# Patient Record
Sex: Male | Born: 1950
Health system: Southern US, Community
[De-identification: ages and names within clinical notes are randomized; demographics above are authoritative.]

## PROBLEM LIST (undated history)

## (undated) DIAGNOSIS — D72820 Lymphocytosis (symptomatic): Secondary | ICD-10-CM

## (undated) DIAGNOSIS — E7800 Pure hypercholesterolemia, unspecified: Secondary | ICD-10-CM

## (undated) DIAGNOSIS — E119 Type 2 diabetes mellitus without complications: Secondary | ICD-10-CM

## (undated) DIAGNOSIS — I10 Essential (primary) hypertension: Secondary | ICD-10-CM

## (undated) DIAGNOSIS — D72829 Elevated white blood cell count, unspecified: Secondary | ICD-10-CM

## (undated) DIAGNOSIS — D473 Essential (hemorrhagic) thrombocythemia: Secondary | ICD-10-CM

## (undated) HISTORY — DX: Type 2 diabetes mellitus without complications: E11.9

## (undated) HISTORY — DX: Pure hypercholesterolemia, unspecified: E78.00

## (undated) HISTORY — PX: CHOLECYSTECTOMY: SHX55

## (undated) HISTORY — DX: Elevated white blood cell count, unspecified: D72.829

## (undated) HISTORY — DX: Essential (hemorrhagic) thrombocythemia: D47.3

## (undated) HISTORY — DX: Essential (primary) hypertension: I10

## (undated) HISTORY — DX: Lymphocytosis (symptomatic): D72.820

---

## 2005-01-02 ENCOUNTER — Ambulatory Visit: Payer: Self-pay | Admitting: Internal Medicine

## 2005-01-25 ENCOUNTER — Ambulatory Visit: Payer: Self-pay | Admitting: Internal Medicine

## 2007-03-12 ENCOUNTER — Emergency Department: Payer: Self-pay | Admitting: Emergency Medicine

## 2011-01-22 ENCOUNTER — Ambulatory Visit: Payer: Self-pay | Admitting: Cardiology

## 2015-10-01 ENCOUNTER — Encounter (HOSPITAL_COMMUNITY): Payer: Self-pay | Admitting: Oncology

## 2015-10-01 ENCOUNTER — Encounter (HOSPITAL_COMMUNITY): Payer: Medicare Other

## 2015-10-01 ENCOUNTER — Encounter (HOSPITAL_COMMUNITY): Payer: Medicare Other | Attending: Oncology | Admitting: Oncology

## 2015-10-01 VITALS — BP 158/75 | HR 70 | Temp 97.8°F | Resp 20 | Ht 70.0 in | Wt 214.0 lb

## 2015-10-01 DIAGNOSIS — Z794 Long term (current) use of insulin: Secondary | ICD-10-CM | POA: Diagnosis not present

## 2015-10-01 DIAGNOSIS — Z720 Tobacco use: Secondary | ICD-10-CM

## 2015-10-01 DIAGNOSIS — D72829 Elevated white blood cell count, unspecified: Secondary | ICD-10-CM

## 2015-10-01 DIAGNOSIS — Z7982 Long term (current) use of aspirin: Secondary | ICD-10-CM | POA: Insufficient documentation

## 2015-10-01 DIAGNOSIS — D72820 Lymphocytosis (symptomatic): Secondary | ICD-10-CM | POA: Diagnosis not present

## 2015-10-01 DIAGNOSIS — Z801 Family history of malignant neoplasm of trachea, bronchus and lung: Secondary | ICD-10-CM

## 2015-10-01 DIAGNOSIS — I10 Essential (primary) hypertension: Secondary | ICD-10-CM | POA: Diagnosis not present

## 2015-10-01 DIAGNOSIS — E7800 Pure hypercholesterolemia, unspecified: Secondary | ICD-10-CM | POA: Diagnosis not present

## 2015-10-01 DIAGNOSIS — D72821 Monocytosis (symptomatic): Secondary | ICD-10-CM | POA: Insufficient documentation

## 2015-10-01 DIAGNOSIS — Z9049 Acquired absence of other specified parts of digestive tract: Secondary | ICD-10-CM | POA: Insufficient documentation

## 2015-10-01 DIAGNOSIS — Z7984 Long term (current) use of oral hypoglycemic drugs: Secondary | ICD-10-CM | POA: Insufficient documentation

## 2015-10-01 DIAGNOSIS — D473 Essential (hemorrhagic) thrombocythemia: Secondary | ICD-10-CM

## 2015-10-01 DIAGNOSIS — F17210 Nicotine dependence, cigarettes, uncomplicated: Secondary | ICD-10-CM | POA: Insufficient documentation

## 2015-10-01 DIAGNOSIS — Z823 Family history of stroke: Secondary | ICD-10-CM | POA: Insufficient documentation

## 2015-10-01 DIAGNOSIS — E119 Type 2 diabetes mellitus without complications: Secondary | ICD-10-CM | POA: Insufficient documentation

## 2015-10-01 DIAGNOSIS — Z79899 Other long term (current) drug therapy: Secondary | ICD-10-CM | POA: Diagnosis not present

## 2015-10-01 DIAGNOSIS — D75839 Thrombocytosis, unspecified: Secondary | ICD-10-CM

## 2015-10-01 HISTORY — DX: Lymphocytosis (symptomatic): D72.820

## 2015-10-01 HISTORY — DX: Thrombocytosis, unspecified: D75.839

## 2015-10-01 HISTORY — DX: Elevated white blood cell count, unspecified: D72.829

## 2015-10-01 LAB — COMPREHENSIVE METABOLIC PANEL
ALK PHOS: 67 U/L (ref 38–126)
ALT: 27 U/L (ref 17–63)
AST: 24 U/L (ref 15–41)
Albumin: 4.3 g/dL (ref 3.5–5.0)
Anion gap: 9 (ref 5–15)
BUN: 19 mg/dL (ref 6–20)
CALCIUM: 9.8 mg/dL (ref 8.9–10.3)
CHLORIDE: 102 mmol/L (ref 101–111)
CO2: 24 mmol/L (ref 22–32)
CREATININE: 0.91 mg/dL (ref 0.61–1.24)
GFR calc Af Amer: >60 mL/min (ref >60)
Glucose, Bld: 156 mg/dL — ABNORMAL HIGH (ref 65–99)
Potassium: 4.5 mmol/L (ref 3.5–5.1)
SODIUM: 135 mmol/L (ref 135–145)
Total Bilirubin: 0.6 mg/dL (ref 0.3–1.2)
Total Protein: 7.6 g/dL (ref 6.5–8.1)

## 2015-10-01 LAB — CBC WITH DIFFERENTIAL/PLATELET
Basophils Absolute: 0.1 10*3/uL (ref 0.0–0.1)
Basophils Relative: 1 %
EOS ABS: 0.7 10*3/uL (ref 0.0–0.7)
EOS PCT: 5 %
HCT: 46.8 % (ref 39.0–52.0)
Hemoglobin: 15.8 g/dL (ref 13.0–17.0)
LYMPHS ABS: 4.6 10*3/uL — AB (ref 0.7–4.0)
Lymphocytes Relative: 32 %
MCH: 30.9 pg (ref 26.0–34.0)
MCHC: 33.8 g/dL (ref 30.0–36.0)
MCV: 91.4 fL (ref 78.0–100.0)
MONOS PCT: 10 %
Monocytes Absolute: 1.5 10*3/uL — ABNORMAL HIGH (ref 0.1–1.0)
Neutro Abs: 7.7 10*3/uL (ref 1.7–7.7)
Neutrophils Relative %: 52 %
PLATELETS: 415 10*3/uL — AB (ref 150–400)
RBC: 5.12 MIL/uL (ref 4.22–5.81)
RDW: 13.7 % (ref 11.5–15.5)
WBC: 14.6 10*3/uL — ABNORMAL HIGH (ref 4.0–10.5)

## 2015-10-01 LAB — C-REACTIVE PROTEIN: CRP: 0.6 mg/dL (ref <1.0)

## 2015-10-01 LAB — SEDIMENTATION RATE: Sed Rate: 13 mm/hr (ref 0–16)

## 2015-10-01 NOTE — Assessment & Plan Note (Addendum)
Leukocytosis with lymphocytosis, monocytosis, and eosinophilia dating back to at least 03/28/2015 with a mild thrombocytosis and normal HGB/HCT/RBC.  I reviewed the potential causes of leukocytosis (specifically mild neutrophilia) including but not limited to: ?Any active inflammatory condition or infection ?Cigarette smoking, which may be the most common cause of mild neutrophilia ?Previously diagnosed hematologic disease (such as acute and chronic leukemias, chronic myeloproliferative or myelodysplastic disease) ?The presence of, and treatment for, a chronic anxiety state, panic disorder, rage, or emotional stress (eg, posttraumatic stress disorder, depression) ?Presence of non-hematologic diseases known to increase neutrophil counts (eg, eclampsia, thyroid storm, hypercortisolism). ?Prior splenectomy or known asplenia ?Positive family history of neutrophilia ?Recent vaccination  ?Medications - Various medications may cause neutrophilia. However,  such cases are rare and appear in the literature as isolated case reports.  Plan today is to proceed with a CBC with peripheral smear review, evaluation for MPD, BCR-ABL to r/o CML, FLOW cytometry, CMET, CRP and ESR to look for occult inflammatory disease  We will see him back once his lab results have come back in approximately two weeks. We will go over everything at that time.  Patient is planning a trip to [facility] with departure on 6/2.  If we are unable to see the patient prior to his trip, we will see him back after he returns from his trip.

## 2015-10-01 NOTE — Patient Instructions (Signed)
[place] at [facility] Discharge Instructions  RECOMMENDATIONS MADE BY THE CONSULTANT AND ANY TEST RESULTS WILL BE SENT TO YOUR REFERRING PHYSICIAN.  Exam and discussion today with Kirby Crigler, PA and Dr. Whitney Muse. Lab work today. Return as scheduled in 2-3 weeks for follow up visit. Call the clinic should you have any questions or concerns.   Thank you for choosing [place] at [facility] to provide your oncology and hematology care.  To afford each patient quality time with our provider, please arrive at least 15 minutes before your scheduled appointment time.   Beginning January 23rd 2017 lab work for the [facility] will be done in the  Main lab at [facility] on 1st floor. If you have a lab appointment with the [facility] please come in thru the  Main Entrance and check in at the main information desk  You need to re-schedule your appointment should you arrive 10 or more minutes late.  We strive to give you quality time with our providers, and arriving late affects you and other patients whose appointments are after yours.  Also, if you no show three or more times for appointments you may be dismissed from the clinic at the providers discretion.     Again, thank you for choosing [facility].  Our hope is that these requests will decrease the amount of time that you wait before being seen by our physicians.       _____________________________________________________________  Should you have questions after your visit to [facility], please contact our office at [phone] between the hours of 8:30 a.m. and 4:30 p.m.  Voicemails left after 4:30 p.m. will not be returned until the following business day.  For prescription refill requests, have your pharmacy contact our office.         Resources For Cancer Patients and their Caregivers ? American Cancer Society: Can assist with transportation, wigs,  general needs, runs Look Good Feel Better.        734-804-5595 ? Cancer Care: Provides financial assistance, online support groups, medication/co-pay assistance.  1-800-813-HOPE 725 728 9296) ? [place] Assists [place] cancer patients and their families through emotional , educational and financial support.  (709)662-0086 ? [place] DSS Where to apply for food stamps, Medicaid and utility assistance. (573) 508-0162 ? RCATS: Transportation to medical appointments. 907 721 4310 ? Social Security Administration: May apply for disability if have a Stage IV cancer. 228-382-2665 317-234-1571 ? [employer], Disability and Transit Services: Assists with nutrition, care and transit needs. [place] Support Programs: @10RELATIVEDAYS @ > Cancer Support Group  2nd Tuesday of the month 1pm-2pm, Journey Room  > Creative Journey  3rd Tuesday of the month 1130am-1pm, Journey Room  > Look Good Feel Better  1st Wednesday of the month 10am-12 noon, Journey Room (Call [place] to register [phone])

## 2015-10-01 NOTE — Assessment & Plan Note (Signed)
Minimal-mild thrombocytosis dating back to at least 2016 (April).  Highest documented platelet count is 464,000.

## 2015-10-01 NOTE — Progress Notes (Signed)
[facility] Hematology/Oncology Consultation   Name: Logan Carey      MRN: [id number]   Date: 10/01/2015 Time:2:31 PM   REFERRING PHYSICIAN:   Angelina Ok, FNP ([facility] [place], [facility])  REASON FOR CONSULT:  Persistent Leukocytosis   DIAGNOSIS:  Leukocytosis with lymphocytosis, monocytosis, and eosinophilia dating back to at least 03/28/2015 with a minimal- mild thrombocytosis and normal HGB/HCT/RBC  HISTORY OF PRESENT ILLNESS:   Logan Carey is a 65 y.o. 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 Merlene Laughter, depression, DM type 2, HTN, dyslipidemia, obesity, and headaches,  who is referred to the [facility] for persistent Leukocytosis with lymphocytosis, monocytosis, and eosinophilia dating back to at least 03/28/2015.  On chart review, the patient is noted to have a mild thrombocytosis and normal HGB/HCT/RBC.  I personally reviewed and went over laboratory results with the patient.  The results are noted within this dictation.  According to records provided by his primary care provider:  I personally reviewed and went over radiographic studies with the patient.  The results are noted within this dictation.  No imaging available related to current issue.  Chart reviewed. Medications reviewed.  The patient's wife dominates the 53 of conversation today. She reports that the patient is here at the patient's primary care provider's request but being made to show up today by his wife.  He does have chronic pain syndrome is being managed by Dr. Merlene Laughter. He notes that he walks 2-3 hours per day. He denies any B symptoms but does note a 50 pound weight loss over one year, intentionally. He does note a chronic headaches.   Review of Systems  Constitutional: Positive for weight loss (Intentional, 50 lbs x 12 months). Negative for fever, chills and  malaise/fatigue.  HENT: Negative for sore throat.   Eyes: Negative.  Negative for blurred vision and double vision.  Respiratory: Negative.  Negative for cough, hemoptysis and shortness of breath.   Cardiovascular: Negative.  Negative for chest pain.  Gastrointestinal: Negative.  Negative for nausea, vomiting, abdominal pain, diarrhea, constipation, blood in stool and melena.  Genitourinary: Negative.  Negative for dysuria, frequency and hematuria.  Musculoskeletal: Negative.  Negative for falls.  Skin: Negative for itching and rash.  Neurological: Positive for headaches (Chronic). Negative for dizziness, seizures, loss of consciousness and weakness.  Endo/Heme/Allergies: Negative.  Does not bruise/bleed easily.  Psychiatric/Behavioral: Negative.  Negative for depression and suicidal ideas. The patient does not have insomnia.      PAST MEDICAL HISTORY:   Past Medical History  Diagnosis Date  . Leukocytosis 10/01/2015  . Thrombocytosis ([place]) 10/01/2015  . High cholesterol   . Diabetes mellitus without complication ([place])   . Hypertension     ALLERGIES: Not on File    MEDICATIONS: I have reviewed the patient's current medications.    No current outpatient prescriptions on file prior to visit.   No current facility-administered medications on file prior to visit.     PAST SURGICAL HISTORY Past Surgical History  Procedure Laterality Date  . Cholecystectomy      FAMILY HISTORY: Family History  Problem Relation Age of Onset  . Stroke Father   . Cancer Father 63    Lung cancer-smoker  . Hypertension Sister   . Hypertension Brother   . Hyperlipidemia Son   Mother deceased at the age of 70 secondary to smoke inhalation  from house fire Father passed away at the age of 34 secondary to stroke and lung cancer (he was a smoker). He has one sister who 5 years old and healthy He has a brother who is 80 years old who is alive but the patient does not speak to him. Last spoken to him  at his father's funeral in the 25s. One daughter 33 years old. She is a Pharmacist, hospital in [place]. On son 34 years old with hypercholesterolemia, otherwise healthy.   SOCIAL HISTORY: Patient has a 1 pack per day smoking history 50 years. He now smokes a quarter of a pack per day. He denies any alcohol abuse now after quitting 40 years ago. He reports that he used to drink approximately half of a fifth of liquor (Jim Beam) per weekend. He denies any illicit drug abuse. 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 He has since been on disability since 2007. He notes that he is [place] and religion. He's been married for 30 years to his current wife, Logan Carey. He's previously divorced. His son is from his previous marriage. His daughter, 55 years old is from his current marriage.   Social History   Social History  . Marital Status: Married    Spouse Name: N/A  . Number of Children: N/A  . Years of Education: N/A   Social History Main Topics  . Smoking status: Current Every Day Smoker -- 0.25 packs/day for 50 years    Types: Cigarettes  . Smokeless tobacco: None  . Alcohol Use: No     Comment: Use to drink 1/2 of a 5th of Jim Beam every weekend quitting in ~ 1977  . Drug Use: No  . Sexual Activity: Not Asked   Other Topics Concern  . None   Social History Narrative  . None   Outpatient Encounter Prescriptions as of 10/01/2015  Medication Sig Note  . aspirin 81 MG tablet Take 81 mg by mouth daily.   . benazepril (LOTENSIN) 20 MG tablet Take 20 mg by mouth 2 (two) times daily. 10/01/2015: Received from: External Pharmacy Received Sig:   . buPROPion (WELLBUTRIN XL) 150 MG 24 hr tablet Take 300 mg by mouth daily. 10/01/2015: Received from: External Pharmacy Received Sig:   . diazepam (VALIUM) 5 MG tablet Take 5 mg by mouth 3 (three) times daily. 10/01/2015: Received from: External Pharmacy Received Sig:   . fenofibrate 160  MG tablet Take 160 mg by mouth daily. 10/01/2015: Received from: External Pharmacy Received Sig:   . gabapentin (NEURONTIN) 300 MG capsule 1 capsule at 0800; 1 capsule at 1400; 2 capsules at bedtime 10/01/2015: Received from: External Pharmacy Received Sig:   . HUMALOG KWIKPEN 100 UNIT/ML KiwkPen Inject 35 Units into the skin 3 (three) times daily. 10/01/2015: Received from: External Pharmacy Received Sig:   . insulin detemir (LEVEMIR) 100 UNIT/ML injection Inject 70 Units into the skin at bedtime.   . metFORMIN (GLUCOPHAGE-XR) 500 MG 24 hr tablet Take 500 mg by mouth. 4 tabs daily with supper 10/01/2015: Received from: External Pharmacy Received Sig:   . NOVOTWIST 32G X 5 MM MISC  10/01/2015: Received from: External Pharmacy  . ONE TOUCH ULTRA TEST test strip  10/01/2015: Received from: External Pharmacy  . OVER THE COUNTER MEDICATION    . OVER THE COUNTER MEDICATION    . oxyCODONE-acetaminophen (PERCOCET) 10-325 MG tablet Take 10-325 tablets by mouth every 8 (eight) hours as  needed. 10/01/2015: Received from: External Pharmacy Received Sig:   . pravastatin (PRAVACHOL) 20 MG tablet Take 20 mg by mouth at bedtime. 10/01/2015: Received from: External Pharmacy Received Sig:   . topiramate (TOPAMAX) 25 MG tablet Take 3 tablets by mouth at bedtime. 10/01/2015: Received from: External Pharmacy Received Sig:    No facility-administered encounter medications on file as of 10/01/2015.    PERFORMANCE STATUS: The patient's performance status is 0 - Asymptomatic  PHYSICAL EXAM: Most Recent Vital Signs: Blood pressure 158/75, pulse 70, temperature 97.8 F (36.6 C), temperature source Oral, resp. rate 20, height 5' 10"  (1.778 m), weight 214 lb (97.07 kg), SpO2 97 %. General appearance: alert, cooperative, appears stated age, no distress, mildly obese and accompanied by his wife, Logan Carey Head: Normocephalic, without obvious abnormality, atraumatic Ears: normal TM's and external ear canals both ears Throat: normal  findings: lips normal without lesions, buccal mucosa normal, tongue midline and normal, soft palate, uvula, and tonsils normal and oropharynx pink & moist without lesions or evidence of thrush and abnormal findings: dentition: upper and lower dentures Neck: no adenopathy, supple, symmetrical, trachea midline and thyroid not enlarged, symmetric, no tenderness/mass/nodules Lungs: clear to auscultation bilaterally and normal percussion bilaterally Heart: regular rate and rhythm, S1, S2 normal, no murmur, click, rub or gallop Abdomen: soft, non-tender; bowel sounds normal; no masses,  no organomegaly Extremities: extremities normal, atraumatic, no cyanosis or edema, no edema, redness or tenderness in the calves or thighs and Left leg in a stability brace Skin: Skin color, texture, turgor normal. No rashes or lesions Lymph nodes: Cervical, supraclavicular, and axillary nodes normal. Neurologic: Alert and oriented X 3, normal strength and tone. Normal symmetric reflexes. Normal coordination and gait  LABORATORY DATA:  No labs in [facility] link.  07/05/2015: WBC 11.7 RBC 5.05 Hemoglobin 14.7 Hematocrit 45.1% MCV 89 MCHC 32.6 Platelets: 446 Neutrophils 5.8 Lymphocytes: 3.9 Monocytes: 1.2 Eosinophils: 0.9 Basophils: 0.1 Hemoglobin A1c: 7.2  05/04/2015: WBC 12.7 RBC 5.05 Hemoglobin 15.1 Hematocrit 44.8% MCV 89 MCHC 33.7  Platelets: 420 Neutrophils: 6.0 Lymphocytes: 4.2 Monocytes: 1.5 Eosinophils: 0.8 Basophils: 0.1  03/28/2015 WBC 12.0 RBC 5.06 Hemoglobin 15.4 Hematocrit 45.2% MCV 89 MCHC 34.1  Platelets: 464 Neutrophils: 6.0 Lymphocytes: 3.4 Monocytes: 1.6 Eosinophils: 0.8 Basophils: 0.1 Hemoglobin A1c: 8.8 PSA: 3.3  08/24/2014: WBC 13.4 RBC: 5.01 Hemoglobin: 15.2 Hematocrit: 46.8% MCV: 93.4 MCHC: 32.5 Platelets: 413 Neutrophil: 5.85 Lymphocytes: 5.18 Monocyte: 1.46 Eosinophils: 0.65 Basophil: 0.12 Hemoglobin A1c: 8.5  RADIOGRAPHY: No results found.  N/A   PATHOLOGY:  N/A  ASSESSMENT/PLAN:   Leukocytosis Leukocytosis with lymphocytosis, monocytosis, and eosinophilia dating back to at least 03/28/2015 with a mild thrombocytosis and normal HGB/HCT/RBC.  I reviewed the potential causes of leukocytosis (specifically mild neutrophilia) including but not limited to: ?Any active inflammatory condition or infection ?Cigarette smoking, which may be the most common cause of mild neutrophilia ?Previously diagnosed hematologic disease (such as acute and chronic leukemias, chronic myeloproliferative or myelodysplastic disease) ?The presence of, and treatment for, a chronic anxiety state, panic disorder, rage, or emotional stress (eg, posttraumatic stress disorder, depression) ?Presence of non-hematologic diseases known to increase neutrophil counts (eg, eclampsia, thyroid storm, hypercortisolism). ?Prior splenectomy or known asplenia ?Positive family history of neutrophilia ?Recent vaccination  ?Medications - Various medications may cause neutrophilia. However,  such cases are rare and appear in the literature as isolated case reports.  Plan today is to proceed with a CBC with peripheral smear review, evaluation for MPD, BCR-ABL to r/o CML, FLOW cytometry, CMET, CRP and  ESR to look for occult inflammatory disease  We will see him back once his lab results have come back in approximately two weeks. We will go over everything at that time.  Patient is planning a trip to [facility] with departure on 6/2.  If we are unable to see the patient prior to his trip, we will see him back after he returns from his trip.    Thrombocytosis ([place]) Minimal-mild thrombocytosis dating back to at least 2016 (April).  Highest documented platelet count is 464,000.  ORDERS PLACED FOR THIS ENCOUNTER: Orders Placed This Encounter  Procedures  . CBC with Differential  . Comprehensive metabolic panel  . Sedimentation rate  . Pathologist smear review  . C-reactive protein  .  BCR-ABL1, CML/ALL, PCR, QUANT  . JAK2 V617F, Rfx CALR/E12/MPL   FLOW CYTOMETRY  All questions were answered. The patient knows to call the clinic with any problems, questions or concerns. We can certainly see the patient much sooner if necessary.   This note is electronically signed by:  Molli Hazard, MD  10/01/2015 2:31 PM

## 2015-10-02 LAB — PATHOLOGIST SMEAR REVIEW

## 2015-10-05 LAB — BCR-ABL1, CML/ALL, PCR, QUANT

## 2015-10-08 ENCOUNTER — Encounter (HOSPITAL_COMMUNITY): Payer: Self-pay | Admitting: Oncology

## 2015-10-15 LAB — CALR + JAK2 E12-15 + MPL (REFLEXED)

## 2015-10-15 LAB — JAK2 V617F, W REFLEX TO CALR/E12/MPL

## 2015-10-18 ENCOUNTER — Encounter (HOSPITAL_COMMUNITY): Payer: Self-pay | Admitting: Hematology & Oncology

## 2015-10-18 ENCOUNTER — Encounter (HOSPITAL_COMMUNITY): Payer: Medicare Other | Attending: Hematology & Oncology | Admitting: Hematology & Oncology

## 2015-10-18 VITALS — BP 166/86 | HR 72 | Temp 98.0°F | Resp 18 | Wt 213.5 lb

## 2015-10-18 DIAGNOSIS — Z794 Long term (current) use of insulin: Secondary | ICD-10-CM | POA: Insufficient documentation

## 2015-10-18 DIAGNOSIS — D72829 Elevated white blood cell count, unspecified: Secondary | ICD-10-CM | POA: Insufficient documentation

## 2015-10-18 DIAGNOSIS — D473 Essential (hemorrhagic) thrombocythemia: Secondary | ICD-10-CM | POA: Diagnosis not present

## 2015-10-18 DIAGNOSIS — Z7984 Long term (current) use of oral hypoglycemic drugs: Secondary | ICD-10-CM | POA: Insufficient documentation

## 2015-10-18 DIAGNOSIS — F17210 Nicotine dependence, cigarettes, uncomplicated: Secondary | ICD-10-CM | POA: Insufficient documentation

## 2015-10-18 DIAGNOSIS — E7800 Pure hypercholesterolemia, unspecified: Secondary | ICD-10-CM | POA: Insufficient documentation

## 2015-10-18 DIAGNOSIS — Z7982 Long term (current) use of aspirin: Secondary | ICD-10-CM | POA: Insufficient documentation

## 2015-10-18 DIAGNOSIS — Z720 Tobacco use: Secondary | ICD-10-CM | POA: Diagnosis not present

## 2015-10-18 DIAGNOSIS — I10 Essential (primary) hypertension: Secondary | ICD-10-CM | POA: Insufficient documentation

## 2015-10-18 DIAGNOSIS — D72820 Lymphocytosis (symptomatic): Secondary | ICD-10-CM | POA: Insufficient documentation

## 2015-10-18 DIAGNOSIS — D72821 Monocytosis (symptomatic): Secondary | ICD-10-CM | POA: Insufficient documentation

## 2015-10-18 DIAGNOSIS — D75839 Thrombocytosis, unspecified: Secondary | ICD-10-CM

## 2015-10-18 DIAGNOSIS — Z79899 Other long term (current) drug therapy: Secondary | ICD-10-CM | POA: Insufficient documentation

## 2015-10-18 DIAGNOSIS — Z801 Family history of malignant neoplasm of trachea, bronchus and lung: Secondary | ICD-10-CM | POA: Insufficient documentation

## 2015-10-18 DIAGNOSIS — E119 Type 2 diabetes mellitus without complications: Secondary | ICD-10-CM | POA: Insufficient documentation

## 2015-10-18 DIAGNOSIS — Z823 Family history of stroke: Secondary | ICD-10-CM | POA: Insufficient documentation

## 2015-10-18 DIAGNOSIS — Z9049 Acquired absence of other specified parts of digestive tract: Secondary | ICD-10-CM | POA: Insufficient documentation

## 2015-10-18 NOTE — Patient Instructions (Signed)
[place] at [facility] Discharge Instructions  RECOMMENDATIONS MADE BY THE CONSULTANT AND ANY TEST RESULTS WILL BE SENT TO YOUR REFERRING PHYSICIAN.  Bone marrow biopsy 11/06/15  Return to clinic 2 weeks after biopsy   Thank you for choosing [place] at [facility] to provide your oncology and hematology care.  To afford each patient quality time with our provider, please arrive at least 15 minutes before your scheduled appointment time.   Beginning January 23rd 2017 lab work for the [facility] will be done in the  Main lab at [facility] on 1st floor. If you have a lab appointment with the [facility] please come in thru the  Main Entrance and check in at the main information desk  You need to re-schedule your appointment should you arrive 10 or more minutes late.  We strive to give you quality time with our providers, and arriving late affects you and other patients whose appointments are after yours.  Also, if you no show three or more times for appointments you may be dismissed from the clinic at the providers discretion.     Again, thank you for choosing [facility].  Our hope is that these requests will decrease the amount of time that you wait before being seen by our physicians.       _____________________________________________________________  Should you have questions after your visit to [facility], please contact our office at [phone] between the hours of 8:30 a.m. and 4:30 p.m.  Voicemails left after 4:30 p.m. will not be returned until the following business day.  For prescription refill requests, have your pharmacy contact our office.         Resources For Cancer Patients and their Caregivers ? American Cancer Society: Can assist with transportation, wigs, general needs, runs Look Good Feel Better.        516-086-0649 ? Cancer Care: Provides financial assistance, online support  groups, medication/co-pay assistance.  1-800-813-HOPE (254)710-5684) ? [place] Assists [place] cancer patients and their families through emotional , educational and financial support.  475-624-3806 ? [place] DSS Where to apply for food stamps, Medicaid and utility assistance. (351)168-3487 ? RCATS: Transportation to medical appointments. 262-418-4575 ? Social Security Administration: May apply for disability if have a Stage IV cancer. ([phone] 769-2473 734-356-3651 ? [employer], Disability and Transit Services: Assists with nutrition, care and transit needs. [place] Support Programs: _0 @ > Cancer Support Group  2nd Tuesday of the month 1pm-2pm, Journey Room  > Creative Journey  3rd Tuesday of the month 1130am-1pm, Journey Room  > Look Good Feel Better  1st Wednesday of the month 10am-12 noon, Journey Room (Call [place] to register [phone])

## 2015-10-18 NOTE — Progress Notes (Signed)
[facility] Hematology/Oncology Consultation   Name: Logan Carey      MRN: [id number]   Date: 10/18/2015 Time:11:05 AM   REFERRING PHYSICIAN:   Angelina Ok, FNP ([facility] [place], [facility])  REASON FOR CONSULT:  Persistent Leukocytosis   DIAGNOSIS:  Leukocytosis with lymphocytosis, monocytosis, and eosinophilia dating back to at least 03/28/2015 with a minimal- mild thrombocytosis and normal HGB/HCT/RBC  HISTORY OF PRESENT ILLNESS:   Logan Carey is a 65 y.o. 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 Merlene Laughter, depression, DM type 2, HTN, dyslipidemia, obesity, and headaches,  who is referred to the [facility] for persistent Leukocytosis with lymphocytosis, monocytosis, and eosinophilia dating back to at least 03/28/2015.  On chart review, the patient is noted to have a mild thrombocytosis and normal HGB/HCT/RBC.  I personally reviewed and went over laboratory results with the patient.  The results are noted within this dictation.  Logan Carey returns to the Cancer center today accompanied by his wife.  He says that he is going to be visiting [place] soon, but that their son-in-law has an "interesting approach to their upcoming visit; that it's "his" house, not "his and their daughter's house." He is going to [place] tomorrow and will be gone through next Sunday.  He notes no changes since his last visit. Overall feels well. Appetite and energy are unchanged.     Review of Systems  Constitutional: Positive for weight loss (Intentional, 50 lbs x 12 months). Negative for fever, chills and malaise/fatigue.  HENT: Negative for sore throat.   Eyes: Negative.  Negative for blurred vision and double vision.  Respiratory: Negative.  Negative for cough, hemoptysis and shortness of breath.   Cardiovascular: Negative.  Negative for chest pain.    Gastrointestinal: Negative.  Negative for nausea, vomiting, abdominal pain, diarrhea, constipation, blood in stool and melena.  Genitourinary: Negative.  Negative for dysuria, frequency and hematuria.  Musculoskeletal: Negative.  Negative for falls.  Skin: Negative for itching and rash.  Neurological: Positive for headaches (Chronic). Negative for dizziness, seizures, loss of consciousness and weakness.  Endo/Heme/Allergies: Negative.  Does not bruise/bleed easily.  Psychiatric/Behavioral: Negative.  Negative for depression and suicidal ideas. The patient does not have insomnia.   14 point review of systems was performed and is negative except as detailed under history of present illness and above  PAST MEDICAL HISTORY:   Past Medical History  Diagnosis Date  . Leukocytosis 10/01/2015  . Thrombocytosis ([place]) 10/01/2015  . High cholesterol   . Diabetes mellitus without complication ([place])   . Hypertension     ALLERGIES: Not on File    MEDICATIONS: I have reviewed the patient's current medications.    Current Outpatient Prescriptions on File Prior to Visit  Medication Sig Dispense Refill  . aspirin 81 MG tablet Take 81 mg by mouth daily.    . benazepril (LOTENSIN) 20 MG tablet Take 20 mg by mouth 2 (two) times daily.    Marland Kitchen buPROPion (WELLBUTRIN XL) 150 MG 24 hr tablet Take 300 mg by mouth daily.    . diazepam (VALIUM) 5 MG tablet Take 5 mg by mouth 3 (three) times daily.    . fenofibrate 160 MG tablet Take 160 mg by mouth daily.    Marland Kitchen gabapentin (NEURONTIN) 300 MG capsule 1 capsule at 0800; 1 capsule at 1400; 2 capsules at bedtime    .  HUMALOG KWIKPEN 100 UNIT/ML KiwkPen Inject 35 Units into the skin 3 (three) times daily.    . insulin detemir (LEVEMIR) 100 UNIT/ML injection Inject 70 Units into the skin at bedtime.    . metFORMIN (GLUCOPHAGE-XR) 500 MG 24 hr tablet Take 500 mg by mouth. 4 tabs daily with supper    . NOVOTWIST 32G X 5 MM MISC     . ONE TOUCH ULTRA TEST test strip      . oxyCODONE-acetaminophen (PERCOCET) 10-325 MG tablet Take 10-325 tablets by mouth every 8 (eight) hours as needed.    . pravastatin (PRAVACHOL) 20 MG tablet Take 20 mg by mouth at bedtime.    . topiramate (TOPAMAX) 25 MG tablet Take 3 tablets by mouth at bedtime.    Marland Kitchen OVER THE COUNTER MEDICATION     . OVER THE COUNTER MEDICATION      No current facility-administered medications on file prior to visit.     PAST SURGICAL HISTORY Past Surgical History  Procedure Laterality Date  . Cholecystectomy      FAMILY HISTORY: Family History  Problem Relation Age of Onset  . Stroke Father   . Cancer Father 67    Lung cancer-smoker  . Hypertension Sister   . Hypertension Brother   . Hyperlipidemia Son   Mother deceased at the age of 89 secondary to smoke inhalation from house fire Father passed away at the age of 51 secondary to stroke and lung cancer (he was a smoker). He has one sister who 47 years old and healthy He has a brother who is 61 years old who is alive but the patient does not speak to him. Last spoken to him at his father's funeral in the 58s. One daughter 52 years old. She is a Pharmacist, hospital in [place]. On son 45 years old with hypercholesterolemia, otherwise healthy.   SOCIAL HISTORY: Patient has a 1 pack per day smoking history 50 years. He now smokes a quarter of a pack per day. He denies any alcohol abuse now after quitting 40 years ago. He reports that he used to drink approximately half of a fifth of liquor (Jim Beam) per weekend. He denies any illicit drug abuse. 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 He has since been on disability since 2007. He notes that he is [place] and religion. He's been married for 30 years to his current wife, Logan Carey. He's previously divorced. His son is from his previous marriage. His daughter, 64 years old is from his current marriage.   Social History   Social  History  . Marital Status: Married    Spouse Name: N/A  . Number of Children: N/A  . Years of Education: N/A   Social History Main Topics  . Smoking status: Current Every Day Smoker -- 50 years    Types: Cigarettes  . Smokeless tobacco: None  . Alcohol Use: No     Comment: Use to drink 1/2 of a 5th of Jim Beam every weekend quitting in ~ 1977  . Drug Use: No  . Sexual Activity: Not Asked   Other Topics Concern  . None   Social History Narrative   Outpatient Encounter Prescriptions as of 10/01/2015  Medication Sig Note  . aspirin 81 MG tablet Take 81 mg by mouth daily.   . benazepril (LOTENSIN) 20 MG tablet Take 20 mg by mouth 2 (two) times daily. 10/01/2015: Received from: External Pharmacy Received  Sig:   . buPROPion (WELLBUTRIN XL) 150 MG 24 hr tablet Take 300 mg by mouth daily. 10/01/2015: Received from: External Pharmacy Received Sig:   . diazepam (VALIUM) 5 MG tablet Take 5 mg by mouth 3 (three) times daily. 10/01/2015: Received from: External Pharmacy Received Sig:   . fenofibrate 160 MG tablet Take 160 mg by mouth daily. 10/01/2015: Received from: External Pharmacy Received Sig:   . gabapentin (NEURONTIN) 300 MG capsule 1 capsule at 0800; 1 capsule at 1400; 2 capsules at bedtime 10/01/2015: Received from: External Pharmacy Received Sig:   . HUMALOG KWIKPEN 100 UNIT/ML KiwkPen Inject 35 Units into the skin 3 (three) times daily. 10/01/2015: Received from: External Pharmacy Received Sig:   . insulin detemir (LEVEMIR) 100 UNIT/ML injection Inject 70 Units into the skin at bedtime.   . metFORMIN (GLUCOPHAGE-XR) 500 MG 24 hr tablet Take 500 mg by mouth. 4 tabs daily with supper 10/01/2015: Received from: External Pharmacy Received Sig:   . NOVOTWIST 32G X 5 MM MISC  10/01/2015: Received from: External Pharmacy  . ONE TOUCH ULTRA TEST test strip  10/01/2015: Received from: External Pharmacy  . OVER THE COUNTER MEDICATION    . OVER THE COUNTER MEDICATION    . oxyCODONE-acetaminophen  (PERCOCET) 10-325 MG tablet Take 10-325 tablets by mouth every 8 (eight) hours as needed. 10/01/2015: Received from: External Pharmacy Received Sig:   . pravastatin (PRAVACHOL) 20 MG tablet Take 20 mg by mouth at bedtime. 10/01/2015: Received from: External Pharmacy Received Sig:   . topiramate (TOPAMAX) 25 MG tablet Take 3 tablets by mouth at bedtime. 10/01/2015: Received from: External Pharmacy Received Sig:    No facility-administered encounter medications on file as of 10/01/2015.   14 point review of systems was performed and is negative except as detailed under history of present illness and above    PERFORMANCE STATUS: The patient's performance status is 0 - Asymptomatic  PHYSICAL EXAM: Most Recent Vital Signs: Blood pressure 166/86, pulse 72, temperature 98 F (36.7 C), temperature source Oral, resp. rate 18, weight 213 lb 8 oz (96.843 kg), SpO2 97 %. General appearance: alert, cooperative, appears stated age, no distress, mildly obese and accompanied by his wife, Logan Carey Head: Normocephalic, without obvious abnormality, atraumatic Ears: normal TM's and external ear canals both ears Throat: normal findings: lips normal without lesions, buccal mucosa normal, tongue midline and normal, soft palate, uvula, and tonsils normal and oropharynx pink & moist without lesions or evidence of thrush and abnormal findings: dentition: upper and lower dentures Neck: no adenopathy, supple, symmetrical, trachea midline and thyroid not enlarged, symmetric, no tenderness/mass/nodules Lungs: clear to auscultation bilaterally and normal percussion bilaterally Heart: regular rate and rhythm, S1, S2 normal, no murmur, click, rub or gallop Abdomen: soft, non-tender; bowel sounds normal; no masses,  no organomegaly Extremities: extremities normal, atraumatic, no cyanosis or edema, no edema, redness or tenderness in the calves or thighs and Left leg in a stability brace Skin: Skin color, texture, turgor normal. No  rashes or lesions Lymph nodes: Cervical, supraclavicular, and axillary nodes normal. Neurologic: Alert and oriented X 3, normal strength and tone. Normal symmetric reflexes. Normal coordination and gait  LABORATORY DATA:  No labs in [facility] link.  07/05/2015: WBC 11.7 RBC 5.05 Hemoglobin 14.7 Hematocrit 45.1% MCV 89 MCHC 32.6 Platelets: 446 Neutrophils 5.8 Lymphocytes: 3.9 Monocytes: 1.2 Eosinophils: 0.9 Basophils: 0.1 Hemoglobin A1c: 7.2  05/04/2015: WBC 12.7 RBC 5.05 Hemoglobin 15.1 Hematocrit 44.8% MCV 89 MCHC 33.7  Platelets: 420 Neutrophils: 6.0 Lymphocytes: 4.2  Monocytes: 1.5 Eosinophils: 0.8 Basophils: 0.1  03/28/2015 WBC 12.0 RBC 5.06 Hemoglobin 15.4 Hematocrit 45.2% MCV 89 MCHC 34.1  Platelets: 464 Neutrophils: 6.0 Lymphocytes: 3.4 Monocytes: 1.6 Eosinophils: 0.8 Basophils: 0.1 Hemoglobin A1c: 8.8 PSA: 3.3  08/24/2014: WBC 13.4 RBC: 5.01 Hemoglobin: 15.2 Hematocrit: 46.8% MCV: 93.4 MCHC: 32.5 Platelets: 413 Neutrophil: 5.85 Lymphocytes: 5.18 Monocyte: 1.46 Eosinophils: 0.65 Basophil: 0.12 Hemoglobin A1c: 8.5  Results for Logan Carey, Logan Carey (MRN [id number]) as of 10/21/2015 15:38  Ref. Range 10/01/2015 12:29  CRP Latest Ref Range: <1.0 mg/dL 0.6      Results for Logan Carey, Logan Carey (MRN [id number]) as of 10/21/2015 15:38  Ref. Range 10/01/2015 12:29  Sed Rate Latest Ref Range: 0-16 mm/hr 13     JAK 2 EXON 12 mutation negative JAK 2 V617 F mutation negative CALR mutation negative MPL mutation negative BCR ABL negative   RADIOGRAPHY: No results found. N/A   PATHOLOGY:  N/A  ASSESSMENT/PLAN:  Leukocytosis Thrombocytosis  We reviewed all labs obtained at his last visit. Inflammatory markers are WNL. Genetic studies are unremarkable.  We discussed observation vs. Proceeding with BMBX.  He is interested in proceeding with a BMBX.  We discussed how a BMBX is performed and risks and benefits including bleding and infection. Benefits are  to rule out a primary bone marrow "disorder" not detected by our peripheral studies.  We will order his BMBX and see him back 2 weeks post to review.  Orders Placed This Encounter  Procedures  . CT Biopsy    Standing Status: Future     Number of Occurrences:      Standing Expiration Date: 10/17/2016    Order Specific Question:  Lab orders requested (DO NOT place separate lab orders, these will be automatically ordered during procedure specimen collection):    Answer:  Surgical Pathology    Order Specific Question:  Reason for Exam (SYMPTOM  OR DIAGNOSIS REQUIRED)    Answer:  BMBX and aspirate, leukocytosis and thrombocytosis, flow cytometry and cytogenetics    Order Specific Question:  Preferred imaging location?    Answer:  [facility]    All questions were answered. The patient knows to call the clinic with any problems, questions or concerns. We can certainly see the patient much sooner if necessary.  This document serves as a record of services personally performed by Ancil Linsey, MD. It was created on her behalf by Toni Amend, a trained medical scribe. The creation of this record is based on the scribe's personal observations and the provider's statements to them. This document has been checked and approved by the attending provider.  I have reviewed the above documentation for accuracy and completeness and I agree with the above.  This note is electronically signed by:  Molli Hazard, MD  10/18/2015 11:05 AM

## 2015-11-05 ENCOUNTER — Other Ambulatory Visit: Payer: Self-pay | Admitting: Radiology

## 2015-11-06 ENCOUNTER — Ambulatory Visit (HOSPITAL_COMMUNITY)
Admission: RE | Admit: 2015-11-06 | Discharge: 2015-11-06 | Disposition: A | Discharge status: Home / Self Care | Payer: Medicare Other | Source: Ambulatory Visit | Attending: Hematology & Oncology | Admitting: Hematology & Oncology

## 2015-11-06 ENCOUNTER — Encounter (HOSPITAL_COMMUNITY): Payer: Self-pay

## 2015-11-06 DIAGNOSIS — D72829 Elevated white blood cell count, unspecified: Secondary | ICD-10-CM | POA: Diagnosis present

## 2015-11-06 DIAGNOSIS — E7800 Pure hypercholesterolemia, unspecified: Secondary | ICD-10-CM | POA: Insufficient documentation

## 2015-11-06 DIAGNOSIS — D75839 Thrombocytosis, unspecified: Secondary | ICD-10-CM

## 2015-11-06 DIAGNOSIS — D473 Essential (hemorrhagic) thrombocythemia: Secondary | ICD-10-CM | POA: Diagnosis present

## 2015-11-06 DIAGNOSIS — Z7984 Long term (current) use of oral hypoglycemic drugs: Secondary | ICD-10-CM | POA: Diagnosis not present

## 2015-11-06 DIAGNOSIS — I10 Essential (primary) hypertension: Secondary | ICD-10-CM | POA: Diagnosis not present

## 2015-11-06 DIAGNOSIS — E119 Type 2 diabetes mellitus without complications: Secondary | ICD-10-CM | POA: Diagnosis not present

## 2015-11-06 DIAGNOSIS — Z7982 Long term (current) use of aspirin: Secondary | ICD-10-CM | POA: Insufficient documentation

## 2015-11-06 DIAGNOSIS — D72822 Plasmacytosis: Secondary | ICD-10-CM | POA: Insufficient documentation

## 2015-11-06 LAB — CBC
HCT: 45.6 % (ref 39.0–52.0)
Hemoglobin: 16 g/dL (ref 13.0–17.0)
MCH: 31.1 pg (ref 26.0–34.0)
MCHC: 35.1 g/dL (ref 30.0–36.0)
MCV: 88.7 fL (ref 78.0–100.0)
PLATELETS: 454 10*3/uL — AB (ref 150–400)
RBC: 5.14 MIL/uL (ref 4.22–5.81)
RDW: 13.9 % (ref 11.5–15.5)
WBC: 16.7 10*3/uL — AB (ref 4.0–10.5)

## 2015-11-06 LAB — GLUCOSE, CAPILLARY: GLUCOSE-CAPILLARY: 146 mg/dL — AB (ref 65–99)

## 2015-11-06 LAB — PROTIME-INR
INR: 1.03 (ref 0.00–1.49)
Prothrombin Time: 13.2 seconds (ref 11.6–15.2)

## 2015-11-06 LAB — APTT: APTT: 29 s (ref 24–37)

## 2015-11-06 LAB — BONE MARROW EXAM

## 2015-11-06 MED ORDER — MIDAZOLAM HCL 2 MG/2ML IJ SOLN
INTRAMUSCULAR | Status: AC | PRN
  Administered 2015-11-06 (×2): 1 mg via INTRAVENOUS

## 2015-11-06 MED ORDER — MIDAZOLAM HCL 2 MG/2ML IJ SOLN
INTRAMUSCULAR | Status: AC
  Filled 2015-11-06: qty 4

## 2015-11-06 MED ORDER — SODIUM CHLORIDE 0.9 % IV SOLN
Freq: Once | INTRAVENOUS | Status: AC
  Administered 2015-11-06: 08:00:00 via INTRAVENOUS

## 2015-11-06 MED ORDER — FENTANYL CITRATE (PF) 100 MCG/2ML IJ SOLN
INTRAMUSCULAR | Status: AC | PRN
  Administered 2015-11-06: 50 ug via INTRAVENOUS
  Administered 2015-11-06: 25 ug via INTRAVENOUS

## 2015-11-06 MED ORDER — FENTANYL CITRATE (PF) 100 MCG/2ML IJ SOLN
INTRAMUSCULAR | Status: AC
  Filled 2015-11-06: qty 4

## 2015-11-06 NOTE — Discharge Instructions (Signed)
Bone Marrow Aspiration and Bone Marrow Biopsy °Bone marrow aspiration and bone marrow biopsy are procedures that are done to diagnose blood disorders. You may also have one of these procedures to help diagnose infections or some types of cancer. °Bone marrow is the soft tissue that is inside your bones. Blood cells are produced in bone marrow. For bone marrow aspiration, a sample of tissue in liquid form is removed from inside your bone. For a bone marrow biopsy, a small core of bone marrow tissue is removed. Then these samples are examined under a microscope or tested in a lab. °You may need these procedures if you have an abnormal complete blood count (CBC). The aspiration or biopsy sample is usually taken from the top of your hip bone. Sometimes, an aspiration sample is taken from your chest bone (sternum). °LET YOUR HEALTH CARE PROVIDER KNOW ABOUT: °· Any allergies you have. °· All medicines you are taking, including vitamins, herbs, eye drops, creams, and over-the-counter medicines. °· Previous problems you or members of your family have had with the use of anesthetics. °· Any blood disorders you have. °· Previous surgeries you have had. °· Any medical conditions you may have. °· Whether you are pregnant or you think that you may be pregnant. °RISKS AND COMPLICATIONS °Generally, this is a safe procedure. However, problems may occur, including: °· Infection. °· Bleeding. °BEFORE THE PROCEDURE °· Ask your health care provider about: °¨ Changing or stopping your regular medicines. This is especially important if you are taking diabetes medicines or blood thinners. °¨ Taking medicines such as aspirin and ibuprofen. These medicines can thin your blood. Do not take these medicines before your procedure if your health care provider instructs you not to. °· Plan to have someone take you home after the procedure. °· If you go home right after the procedure, plan to have someone with you for 24 hours. °PROCEDURE  °· An  IV tube may be inserted into one of your veins. °· The injection site will be cleaned with a germ-killing solution (antiseptic). °· You will be given one or more of the following: °¨ A medicine that helps you relax (sedative). °¨ A medicine that numbs the area (local anesthetic). °· The bone marrow sample will be removed as follows: °¨ For an aspiration, a hollow needle will be inserted through your skin and into your bone. Bone marrow fluid will be drawn up into a syringe. °¨ For a biopsy, your health care provider will use a hollow needle to remove a core of tissue from your bone marrow. °· The needle will be removed. °· A bandage (dressing) will be placed over the insertion site and taped in place. °The procedure may vary among health care providers and hospitals. °AFTER THE PROCEDURE °· Your blood pressure, heart rate, breathing rate, and blood oxygen level will be monitored often until the medicines you were given have worn off. °· Return to your normal activities as directed by your health care provider. °  °This information is not intended to replace advice given to you by your health care provider. Make sure you discuss any questions you have with your health care provider. °  °Document Released: 05/08/2004 Document Revised: 09/19/2014 Document Reviewed: 04/26/2014 °Elsevier Interactive Patient Education ©2016 [employer] Inc[employer] ° °Bone Marrow Aspiration and Bone Marrow Biopsy, Care After °Refer to this sheet in the next few weeks. These instructions provide you with information about caring for yourself after your procedure. Your health care provider may also give   you more specific instructions. Your treatment has been planned according to current medical practices, but problems sometimes occur. Call your health care provider if you have any problems or questions after your procedure. °WHAT TO EXPECT AFTER THE PROCEDURE °After your procedure, it is common to have: °· Soreness or tenderness around the puncture  site. °· Bruising. °HOME CARE INSTRUCTIONS °· Take medicines only as directed by your health care provider. °· Follow your health care provider's instructions about: °¨ Puncture site care. °¨ Bandage (dressing) changes and removal. °· Bathe and shower as directed by your health care provider. °· Check your puncture site every day for signs of infection. Watch for: °¨ Redness, swelling, or pain. °¨ Fluid, blood, or pus. °· Return to your normal activities as directed by your health care provider. °· Keep all follow-up visits as directed by your health care provider. This is important. °SEEK MEDICAL CARE IF: °· You have a fever. °· You have uncontrollable bleeding. °· You have redness, swelling, or pain at the site of your puncture. °· You have fluid, blood, or pus coming from your puncture site. °  °This information is not intended to replace advice given to you by your health care provider. Make sure you discuss any questions you have with your health care provider. °  °Document Released: 11/22/2004 Document Revised: 09/19/2014 Document Reviewed: 04/26/2014 °Elsevier Interactive Patient Education ©2016 Elsevier Inc. ° °Moderate Conscious Sedation, Adult, Care After °Refer to this sheet in the next few weeks. These instructions provide you with information on caring for yourself after your procedure. Your health care provider may also give you more specific instructions. Your treatment has been planned according to current medical practices, but problems sometimes occur. Call your health care provider if you have any problems or questions after your procedure. °WHAT TO EXPECT AFTER THE PROCEDURE  °After your procedure: °· You may feel sleepy, clumsy, and have poor balance for several hours. °· Vomiting may occur if you eat too soon after the procedure. °HOME CARE INSTRUCTIONS °· Do not participate in any activities where you could become injured for at least 24 hours. Do not: °¨ Drive. °¨ Swim. °¨ Ride a  bicycle. °¨ Operate heavy machinery. °¨ Cook. °¨ Use power tools. °¨ Climb ladders. °¨ Work from a high place. °· Do not make important decisions or sign legal documents until you are improved. °· If you vomit, drink water, juice, or soup when you can drink without vomiting. Make sure you have little or no nausea before eating solid foods. °· Only take over-the-counter or prescription medicines for pain, discomfort, or fever as directed by your health care provider. °· Make sure you and your family fully understand everything about the medicines given to you, including what side effects may occur. °· You should not drink alcohol, take sleeping pills, or take medicines that cause drowsiness for at least 24 hours. °· If you smoke, do not smoke without supervision. °· If you are feeling better, you may resume normal activities 24 hours after you were sedated. °· Keep all appointments with your health care provider. °SEEK MEDICAL CARE IF: °· Your skin is pale or bluish in color. °· You continue to feel nauseous or vomit. °· Your pain is getting worse and is not helped by medicine. °· You have bleeding or swelling. °· You are still sleepy or feeling clumsy after 24 hours. °SEEK IMMEDIATE MEDICAL CARE IF: °· You develop a rash. °· You have difficulty breathing. °· You develop any   of allergic problem.  You have a fever. MAKE SURE YOU:  Understand these instructions.  Will watch your condition.  Will get help right away if you are not doing well or get worse.   This information is not intended to replace advice given to you by your health care provider. Make sure you discuss any questions you have with your health care provider.   Document Released: 02/23/2013 Document Revised: 05/26/2014 Document Reviewed: 02/23/2013 Elsevier Interactive Patient Education [employer].

## 2015-11-06 NOTE — Procedures (Signed)
Interventional Radiology Procedure Note  Procedure: CT guided aspirate and core biopsy of right posterior iliac bone Complications: None Recommendations: - Bedrest supine x 1 hrs - OTC's PRN  Pain - Follow biopsy results  Signed,  Virdia Ziesmer S. Altin Sease, DO    

## 2015-11-06 NOTE — H&P (Signed)
Chief Complaint: leukocytosis  Referring Physician:Dr. Ancil Linsey  Supervising Physician: Corrie Mckusick  Patient Status: Out-pt  HPI: Logan Carey is an 65 y.o. male who has had an elevated WBC since last year.  He has undergone multiple labs tests etc, but nothing conclusive has resulted.  He was sent to see Dr. Whitney Muse who felt like after reviewing his case that he needed a bone marrow biopsy.  He presents today for this procedure.  He has no other complaints and is feeling well.  Past Medical History:  Past Medical History  Diagnosis Date  . Leukocytosis 10/01/2015  . Thrombocytosis ([place]) 10/01/2015  . High cholesterol   . Diabetes mellitus without complication ([place])   . Hypertension     Past Surgical History:  Past Surgical History  Procedure Laterality Date  . Cholecystectomy      Family History:  Family History  Problem Relation Age of Onset  . Stroke Father   . Cancer Father 72    Lung cancer-smoker  . Hypertension Sister   . Hypertension Brother   . Hyperlipidemia Son     Social History:  reports that he has been smoking Cigarettes.  He has smoked for the past 50 years. He does not have any smokeless tobacco history on file. He reports that he does not drink alcohol or use illicit drugs.  Allergies: Not on File  Medications:   Medication List    ASK your doctor about these medications        aspirin 81 MG tablet  Take 81 mg by mouth daily.     benazepril 20 MG tablet  Commonly known as:  LOTENSIN  Take 20 mg by mouth 2 (two) times daily.     buPROPion 150 MG 24 hr tablet  Commonly known as:  WELLBUTRIN XL  Take 300 mg by mouth daily.     diazepam 5 MG tablet  Commonly known as:  VALIUM  Take 5 mg by mouth 3 (three) times daily.     fenofibrate 160 MG tablet  Take 160 mg by mouth daily.     gabapentin 300 MG capsule  Commonly known as:  NEURONTIN  1 capsule at 0800; 1 capsule at 1400; 2 capsules at bedtime     HUMALOG KWIKPEN 100  UNIT/ML KiwkPen  Generic drug:  insulin lispro  Inject 35 Units into the skin 3 (three) times daily.     insulin detemir 100 UNIT/ML injection  Commonly known as:  LEVEMIR  Inject 70 Units into the skin at bedtime.     metFORMIN 500 MG 24 hr tablet  Commonly known as:  GLUCOPHAGE-XR  Take 500 mg by mouth. 4 tabs daily with supper     NOVOTWIST 32G X 5 MM Misc  Generic drug:  Insulin Pen Needle     ONE TOUCH ULTRA TEST test strip  Generic drug:  glucose blood     OVER THE COUNTER MEDICATION     OVER THE COUNTER MEDICATION     oxyCODONE-acetaminophen 10-325 MG tablet  Commonly known as:  PERCOCET  Take 10-325 tablets by mouth every 8 (eight) hours as needed.     pravastatin 20 MG tablet  Commonly known as:  PRAVACHOL  Take 20 mg by mouth at bedtime.     topiramate 25 MG tablet  Commonly known as:  TOPAMAX  Take 3 tablets by mouth at bedtime.        Please HPI for pertinent positives, otherwise complete 10 system ROS negative.  Mallampati Score: MD Evaluation Airway: WNL Heart: WNL Abdomen: WNL Chest/ Lungs: WNL ASA  Classification: 2 Mallampati/Airway Score: Two  Physical Exam: Ht _0  (1.778 m)  Wt 213 lb 8 oz (96.843 kg)  BMI 30.63 kg/m2 Body mass index is 30.63 kg/(m^2). General: pleasant, WD, WN white male who is laying in bed in NAD HEENT: head is normocephalic, atraumatic.  Sclera are noninjected.  PERRL.  Ears and nose without any masses or lesions.  Mouth is pink and moist Heart: regular, rate, and rhythm.  Normal s1,s2. No obvious murmurs, gallops, or rubs noted.  Palpable radial and pedal pulses bilaterally Lungs: CTAB, no wheezes, rhonchi, or rales noted.  Respiratory effort nonlabored Abd: soft, NT, ND, +BS, no masses, hernias, or organomegaly MS: all 4 extremities are symmetrical with no cyanosis, clubbing, or edema. Psych: A&Ox3 with an appropriate affect.   Labs: Results for orders placed or performed during the hospital encounter of  11/06/15 (from the past 48 hour(s))  APTT upon arrival     Status: None   Collection Time: 11/06/15  7:20 AM  Result Value Ref Range   aPTT 29 24 - 37 seconds  CBC upon arrival     Status: Abnormal   Collection Time: 11/06/15  7:20 AM  Result Value Ref Range   WBC 16.7 (H) 4.0 - 10.5 K/uL   RBC 5.14 4.22 - 5.81 MIL/uL   Hemoglobin 16.0 13.0 - 17.0 g/dL   HCT 45.6 39.0 - 52.0 %   MCV 88.7 78.0 - 100.0 fL   MCH 31.1 26.0 - 34.0 pg   MCHC 35.1 30.0 - 36.0 g/dL   RDW 13.9 11.5 - 15.5 %   Platelets 454 (H) 150 - 400 K/uL  Protime-INR upon arrival     Status: None   Collection Time: 11/06/15  7:20 AM  Result Value Ref Range   Prothrombin Time 13.2 11.6 - 15.2 seconds   INR 1.03 0.00 - 1.49    Imaging: No results found.  Assessment/Plan 1. Leukocytosis -patient with a persistently elevated WBC.  We will plan for BMBX today for further evaluation -his labs and vitals have been reviewed -Risks and Benefits discussed with the patient including, but not limited to bleeding, infection, damage to adjacent structures or low yield requiring additional tests. All of the patient's questions were answered, patient is agreeable to proceed. Consent signed and in chart.  Thank you for this interesting consult.  I greatly enjoyed meeting Logan Carey and look forward to participating in their care.  A copy of this report was sent to the requesting provider on this date.  Electronically Signed: Henreitta Cea 11/06/2015, 9:02 AM   I spent a total of  30 Minutes  in face to face in clinical consultation, greater than 50% of which was counseling/coordinating care for leukocytosis, needs BMBX

## 2015-11-15 LAB — CHROMOSOME ANALYSIS, BONE MARROW

## 2015-11-20 NOTE — Assessment & Plan Note (Signed)
Leukocytosis with lymphocytosis, monocytosis, and eosinophilia dating back to at least 03/28/2015 with a mild thrombocytosis and normal HGB/HCT/RBC.  Peripheral work-up has been negative including normal inflammatory markers (ESR/CRP), negative BCR/ABL, negative JAK2, CALR, and MPL mutations, and negative peripheral FLOW cytometry.  As a result, the patient opted for a bone marrow aspiration and biopsy.  Pathology is negative for any primary bone marrow issue and findings demonstrate a reactive-like process.  I personally reviewed and went over pathology results with the patient.  I reviewed the potential causes of leukocytosis (specifically mild neutrophilia) including but not limited to: ?Any active inflammatory condition or infection ?Cigarette smoking, which may be the most common cause of mild neutrophilia ?Previously diagnosed hematologic disease (such as acute and chronic leukemias, chronic myeloproliferative or myelodysplastic disease) ?The presence of, and treatment for, a chronic anxiety state, panic disorder, rage, or emotional stress (eg, posttraumatic stress disorder, depression) ?Presence of non-hematologic diseases known to increase neutrophil counts (eg, eclampsia, thyroid storm, hypercortisolism). ?Prior splenectomy or known asplenia ?Positive family history of neutrophilia ?Recent vaccination  ?Medications - Various medications may cause neutrophilia. However,  such cases are rare and appear in the literature as isolated case reports.   Hematologic work-up is negative and complete at this time, including a bone marrow aspiration and biopsy.    Labs in 6 months: CBC diff  Return in 6 months for follow-up.

## 2015-11-20 NOTE — Assessment & Plan Note (Signed)
Minimal-mild thrombocytosis dating back to at least 2016 (April).  Highest documented platelet count is 464,000.

## 2015-11-20 NOTE — Progress Notes (Signed)
Renee Rival, NP [address] [address]  Leukocytosis - Plan: CBC with Differential  Thrombocytosis (HCC) - Plan: CBC with Differential  CURRENT THERAPY: Completion of leukocytosis work-up including a bone marrow aspiration and biopsy on 11/06/2015.  INTERVAL HISTORY: Latron Ribas 65 y.o. male returns for followup of Leukocytosis with lymphocytosis, monocytosis, and eosinophilia dating back to at least 03/28/2015 with a minimal- mild thrombocytosis and normal HGB/HCT/RBC.   He denies any complaints today.  He denies any B symptoms an any new "lumps or bumps."  His appetite remains good without any significant weight change.  He is here today to review his bone marrow biopsy results.  Review of Systems  Constitutional: Negative.  Negative for fever, chills and weight loss.  HENT: Negative.   Eyes: Negative.   Respiratory: Negative.   Cardiovascular: Negative.   Gastrointestinal: Negative.   Genitourinary: Negative.   Musculoskeletal: Negative.   Skin: Negative.   Neurological: Negative.   Endo/Heme/Allergies: Negative.   Psychiatric/Behavioral: Negative.     Past Medical History  Diagnosis Date  . Leukocytosis 10/01/2015  . Thrombocytosis ([place]) 10/01/2015  . High cholesterol   . Diabetes mellitus without complication ([place])   . Hypertension     Past Surgical History  Procedure Laterality Date  . Cholecystectomy      Family History  Problem Relation Age of Onset  . Stroke Father   . Cancer Father 46    Lung cancer-smoker  . Hypertension Sister   . Hypertension Brother   . Hyperlipidemia Son     Social History   Social History  . Marital Status: Married    Spouse Name: N/A  . Number of Children: N/A  . Years of Education: N/A   Social History Main Topics  . Smoking status: Current Every Day Smoker -- 50 years    Types: Cigarettes  . Smokeless tobacco: None  . Alcohol Use: No     Comment: Use to drink 1/2 of a 5th of [employer] every weekend quitting in ~ 1977  . Drug Use: No  . Sexual Activity: Not Asked   Other Topics Concern  . None   Social History Narrative     PHYSICAL EXAMINATION  ECOG PERFORMANCE STATUS: 0 - Asymptomatic  Filed Vitals:   11/21/15 0950  BP: 162/83  Pulse: 65  Temp: 97.9 F (36.6 C)  Resp: 16    GENERAL:alert, healthy, no distress, well nourished, well developed, comfortable, cooperative, obese, smiling and accompanied by his wife. SKIN: skin color, texture, turgor are normal, no rashes or significant lesions HEAD: Normocephalic, No masses, lesions, tenderness or abnormalities EYES: normal, EOMI, Conjunctiva are pink and non-injected EARS: External ears normal OROPHARYNX:lips, buccal mucosa, and tongue normal and mucous membranes are moist  NECK: supple, trachea midline LYMPH:  no palpable lymphadenopathy BREAST:not examined LUNGS: clear to auscultation  HEART: regular rate & rhythm ABDOMEN:abdomen soft and normal bowel sounds BACK: Back symmetric, no curvature. EXTREMITIES:less then 2 second capillary refill, no joint deformities, effusion, or inflammation, no skin discoloration, no cyanosis  NEURO: alert & oriented x 3 with fluent speech, no focal motor/sensory deficits, gait normal   LABORATORY DATA: CBC    Component Value Date/Time   WBC 16.7* 11/06/2015 0720   RBC 5.14 11/06/2015 0720   HGB 16.0 11/06/2015 0720   HCT 45.6 11/06/2015 0720   PLT 454* 11/06/2015 0720   MCV 88.7 11/06/2015 0720   MCH 31.1 11/06/2015 0720   MCHC  35.1 11/06/2015 0720   RDW 13.9 11/06/2015 0720   LYMPHSABS 4.6* 10/01/2015 1229   MONOABS 1.5* 10/01/2015 1229   EOSABS 0.7 10/01/2015 1229   BASOSABS 0.1 10/01/2015 1229      Chemistry      Component Value Date/Time   NA 135 10/01/2015 1229   K 4.5 10/01/2015 1229   CL 102 10/01/2015 1229   CO2 24 10/01/2015 1229   BUN 19 10/01/2015 1229   CREATININE 0.91 10/01/2015 1229      Component Value Date/Time   CALCIUM 9.8  10/01/2015 1229   ALKPHOS 67 10/01/2015 1229   AST 24 10/01/2015 1229   ALT 27 10/01/2015 1229   BILITOT 0.6 10/01/2015 1229     Lab Results  Component Value Date   ESRSEDRATE 13 10/01/2015   Lab Results  Component Value Date   CRP 0.6 10/01/2015     PENDING LABS:   RADIOGRAPHIC STUDIES:  Ct Biopsy  11/06/2015  INDICATION: 65 year old male with a history of leukocytosis. EXAM: CT BIOPSY MEDICATIONS: None. ANESTHESIA/SEDATION: Moderate (conscious) sedation was employed during this procedure. A total of Versed 2.0 mg and Fentanyl 75 mcg was administered intravenously. Moderate Sedation Time: 10 minutes. The patient's level of consciousness and vital signs were monitored continuously by radiology nursing throughout the procedure under my direct supervision. FLUOROSCOPY TIME:  CT COMPLICATIONS: None PROCEDURE: The procedure risks, benefits, and alternatives were explained to the patient. Questions regarding the procedure were encouraged and answered. The patient understands and consents to the procedure. Scout CT of the pelvis was performed for surgical planning purposes. The posterior pelvis was prepped with Betadinein a sterile fashion, and a sterile drape was applied covering the operative field. A sterile gown and sterile gloves were used for the procedure. Local anesthesia was provided with 1% Lidocaine. We targeted the right posterior iliac bone for biopsy. The skin and subcutaneous tissues were infiltrated with 1% lidocaine without epinephrine. A small stab incision was made with an 11 blade scalpel, and an 11 gauge Murphy needle was advanced with CT guidance to the posterior cortex. Manual forced was used to advance the needle through the posterior cortex and the stylet was removed. A bone marrow aspirate was retrieved and passed to a cytotechnologist in the room. The coaxial 14 gauge needle system was then advanced through the [facility] needle for a core biopsy. The core biopsy was retrieved  and also passed to a cytotechnologist. Manual pressure was used for hemostasis and a sterile dressing was placed. No complications were encountered no significant blood loss was encountered. Patient tolerated the procedure well and remained hemodynamically stable throughout. IMPRESSION: Status post CT-guided bone marrow biopsy, with tissue specimen sent to pathology for complete histopathologic analysis Signed, Dulcy Fanny. Earleen Newport, DO [facility] Radiology Electronically Signed   By: Corrie Mckusick D.O.   On: 11/06/2015 11:02     PATHOLOGY:  Diagnosis Bone Marrow, Aspirate,Biopsy, and Clot - HYPERCELLULAR BONE MARROW FOR AGE WITH TRILINEAGE HEMATOPOIESIS. - MILD POLYCLONAL PLASMACYTOSIS. - SEE COMMENT. PERIPHERAL BLOOD: - LEUKOCYTOSIS. - THROMBOCYTOSIS. Diagnosis Note The bone marrow is slightly hypercellular for age with trilineage hematopoiesis including abundant megakaryocytes some of which display abnormal morphology with large and hyperchromatic cells. Significant morphologic changes in other myeloid cell lines are not seen and no increase in blastic cells is identified. The myeloid changes are limited and not considered specific or diagnostic of a myeloproliferative neoplasm especially in view of negative JAK-2 and Calreticulin testing. Nonetheless, clinical follow up  and correlation with cytogenetic studies is recommended. The plasma cells are mildly increased in number representing 6%of all cells and display polyclonal staining pattern for kappa and lambda light chains and hence are likely reactive in nature. (BNS:ecj 11/08/2015) Susanne Greenhouse MD Pathologist, Electronic Signature (Case signed 11/08/2015)    ASSESSMENT AND PLAN:  Leukocytosis Leukocytosis with lymphocytosis, monocytosis, and eosinophilia dating back to at least 03/28/2015 with a mild thrombocytosis and normal HGB/HCT/RBC.  Peripheral work-up has been negative including  normal inflammatory markers (ESR/CRP), negative BCR/ABL, negative JAK2, CALR, and MPL mutations, and negative peripheral FLOW cytometry.  As a result, the patient opted for a bone marrow aspiration and biopsy.  Pathology is negative for any primary bone marrow issue and findings demonstrate a reactive-like process.  I personally reviewed and went over pathology results with the patient.  I reviewed the potential causes of leukocytosis (specifically mild neutrophilia) including but not limited to: ?Any active inflammatory condition or infection ?Cigarette smoking, which may be the most common cause of mild neutrophilia ?Previously diagnosed hematologic disease (such as acute and chronic leukemias, chronic myeloproliferative or myelodysplastic disease) ?The presence of, and treatment for, a chronic anxiety state, panic disorder, rage, or emotional stress (eg, posttraumatic stress disorder, depression) ?Presence of non-hematologic diseases known to increase neutrophil counts (eg, eclampsia, thyroid storm, hypercortisolism). ?Prior splenectomy or known asplenia ?Positive family history of neutrophilia ?Recent vaccination  ?Medications - Various medications may cause neutrophilia. However,  such cases are rare and appear in the literature as isolated case reports.   Hematologic work-up is negative and complete at this time, including a bone marrow aspiration and biopsy.    Labs in 6 months: CBC diff  Return in 6 months for follow-up.  Thrombocytosis ([place]) Minimal-mild thrombocytosis dating back to at least 2016 (April).  Highest documented platelet count is 464,000.    ORDERS PLACED FOR THIS ENCOUNTER: Orders Placed This Encounter  Procedures  . CBC with Differential    MEDICATIONS PRESCRIBED THIS ENCOUNTER: Meds ordered this encounter  Medications  . topiramate (TOPAMAX) 50 MG tablet    Sig: Take 60 mg by mouth 2 (two) times daily. Patient reports topamax has recently changed to 60 mg  twice daily    THERAPY PLAN:  Continued surveillance.  All questions were answered. The patient knows to call the clinic with any problems, questions or concerns. We can certainly see the patient much sooner if necessary.  Patient and plan discussed with Dr. Ancil Linsey and she is in agreement with the aforementioned.   This note is electronically signed by: Doy Mince 11/21/2015 10:16 AM

## 2015-11-21 ENCOUNTER — Encounter (HOSPITAL_COMMUNITY): Payer: Medicare Other | Attending: Oncology | Admitting: Oncology

## 2015-11-21 ENCOUNTER — Encounter (HOSPITAL_COMMUNITY): Payer: Self-pay | Admitting: Oncology

## 2015-11-21 VITALS — BP 162/83 | HR 65 | Temp 97.9°F | Resp 16 | Wt 207.5 lb

## 2015-11-21 DIAGNOSIS — D72820 Lymphocytosis (symptomatic): Secondary | ICD-10-CM | POA: Insufficient documentation

## 2015-11-21 DIAGNOSIS — I10 Essential (primary) hypertension: Secondary | ICD-10-CM | POA: Insufficient documentation

## 2015-11-21 DIAGNOSIS — D72821 Monocytosis (symptomatic): Secondary | ICD-10-CM | POA: Insufficient documentation

## 2015-11-21 DIAGNOSIS — Z7982 Long term (current) use of aspirin: Secondary | ICD-10-CM | POA: Insufficient documentation

## 2015-11-21 DIAGNOSIS — Z79899 Other long term (current) drug therapy: Secondary | ICD-10-CM | POA: Insufficient documentation

## 2015-11-21 DIAGNOSIS — Z794 Long term (current) use of insulin: Secondary | ICD-10-CM | POA: Insufficient documentation

## 2015-11-21 DIAGNOSIS — F17210 Nicotine dependence, cigarettes, uncomplicated: Secondary | ICD-10-CM | POA: Insufficient documentation

## 2015-11-21 DIAGNOSIS — Z7984 Long term (current) use of oral hypoglycemic drugs: Secondary | ICD-10-CM | POA: Insufficient documentation

## 2015-11-21 DIAGNOSIS — Z823 Family history of stroke: Secondary | ICD-10-CM | POA: Insufficient documentation

## 2015-11-21 DIAGNOSIS — D75839 Thrombocytosis, unspecified: Secondary | ICD-10-CM

## 2015-11-21 DIAGNOSIS — Z9049 Acquired absence of other specified parts of digestive tract: Secondary | ICD-10-CM | POA: Insufficient documentation

## 2015-11-21 DIAGNOSIS — D72829 Elevated white blood cell count, unspecified: Secondary | ICD-10-CM | POA: Insufficient documentation

## 2015-11-21 DIAGNOSIS — E119 Type 2 diabetes mellitus without complications: Secondary | ICD-10-CM | POA: Insufficient documentation

## 2015-11-21 DIAGNOSIS — E7800 Pure hypercholesterolemia, unspecified: Secondary | ICD-10-CM | POA: Insufficient documentation

## 2015-11-21 DIAGNOSIS — Z801 Family history of malignant neoplasm of trachea, bronchus and lung: Secondary | ICD-10-CM | POA: Insufficient documentation

## 2015-11-21 DIAGNOSIS — D473 Essential (hemorrhagic) thrombocythemia: Secondary | ICD-10-CM | POA: Diagnosis not present

## 2015-11-21 NOTE — Patient Instructions (Signed)
[place] at [facility] Discharge Instructions  RECOMMENDATIONS MADE BY THE CONSULTANT AND ANY TEST RESULTS WILL BE SENT TO YOUR REFERRING PHYSICIAN.  You were seen by Kirby Crigler, PA today. Return in 6 months for lab work and follow-up visit. Call clinic with any questions or concerns.   Thank you for choosing [place] at [facility] to provide your oncology and hematology care.  To afford each patient quality time with our provider, please arrive at least 15 minutes before your scheduled appointment time.   Beginning January 23rd 2017 lab work for the [facility] will be done in the  Main lab at [facility] on 1st floor. If you have a lab appointment with the [facility] please come in thru the  Main Entrance and check in at the main information desk  You need to re-schedule your appointment should you arrive 10 or more minutes late.  We strive to give you quality time with our providers, and arriving late affects you and other patients whose appointments are after yours.  Also, if you no show three or more times for appointments you may be dismissed from the clinic at the providers discretion.     Again, thank you for choosing [facility].  Our hope is that these requests will decrease the amount of time that you wait before being seen by our physicians.       _____________________________________________________________  Should you have questions after your visit to [facility], please contact our office at [phone] between the hours of 8:30 a.m. and 4:30 p.m.  Voicemails left after 4:30 p.m. will not be returned until the following business day.  For prescription refill requests, have your pharmacy contact our office.         Resources For Cancer Patients and their Caregivers ? American Cancer Society: Can assist with transportation, wigs, general needs, runs Look Good Feel Better.         (640)805-2360 ? Cancer Care: Provides financial assistance, online support groups, medication/co-pay assistance.  1-800-813-HOPE 207-287-8807) ? [place] Assists [place] cancer patients and their families through emotional , educational and financial support.  (845)227-5869 ? [place] DSS Where to apply for food stamps, Medicaid and utility assistance. 929 438 9264 ? RCATS: Transportation to medical appointments. 579-263-3457 ? Social Security Administration: May apply for disability if have a Stage IV cancer. 828-605-3450 (947)520-1581 ? [employer], Disability and Transit Services: Assists with nutrition, care and transit needs. [place] Support Programs: @10RELATIVEDAYS @ > Cancer Support Group  2nd Tuesday of the month 1pm-2pm, Journey Room  > Creative Journey  3rd Tuesday of the month 1130am-1pm, Journey Room  > Look Good Feel Better  1st Wednesday of the month 10am-12 noon, Journey Room (Call [place] to register [phone])

## 2015-11-22 ENCOUNTER — Encounter (HOSPITAL_COMMUNITY): Payer: Self-pay

## 2016-01-14 ENCOUNTER — Other Ambulatory Visit (HOSPITAL_COMMUNITY)
Admission: RE | Admit: 2016-01-14 | Discharge: 2016-01-14 | Disposition: A | Discharge status: Home / Self Care | Payer: Medicare Other | Source: Ambulatory Visit | Attending: Neurology | Admitting: Neurology

## 2016-01-14 DIAGNOSIS — R5383 Other fatigue: Secondary | ICD-10-CM | POA: Diagnosis present

## 2016-01-14 DIAGNOSIS — E538 Deficiency of other specified B group vitamins: Secondary | ICD-10-CM | POA: Diagnosis present

## 2016-01-14 DIAGNOSIS — E559 Vitamin D deficiency, unspecified: Secondary | ICD-10-CM | POA: Insufficient documentation

## 2016-01-14 DIAGNOSIS — M810 Age-related osteoporosis without current pathological fracture: Secondary | ICD-10-CM | POA: Insufficient documentation

## 2016-01-14 LAB — VITAMIN B12: VITAMIN B 12: 491 pg/mL (ref 180–914)

## 2016-01-14 LAB — COMPREHENSIVE METABOLIC PANEL
ALK PHOS: 67 U/L (ref 38–126)
ALT: 31 U/L (ref 17–63)
AST: 25 U/L (ref 15–41)
Albumin: 3.9 g/dL (ref 3.5–5.0)
Anion gap: 7 (ref 5–15)
BILIRUBIN TOTAL: 0.3 mg/dL (ref 0.3–1.2)
BUN: 15 mg/dL (ref 6–20)
CALCIUM: 9.4 mg/dL (ref 8.9–10.3)
CO2: 24 mmol/L (ref 22–32)
CREATININE: 0.89 mg/dL (ref 0.61–1.24)
Chloride: 105 mmol/L (ref 101–111)
GFR calc Af Amer: >60 mL/min (ref >60)
Glucose, Bld: 204 mg/dL — ABNORMAL HIGH (ref 65–99)
POTASSIUM: 3.9 mmol/L (ref 3.5–5.1)
Sodium: 136 mmol/L (ref 135–145)
TOTAL PROTEIN: 6.9 g/dL (ref 6.5–8.1)

## 2016-01-14 LAB — CBC WITH DIFFERENTIAL/PLATELET
BASOS ABS: 0.1 10*3/uL (ref 0.0–0.1)
Basophils Relative: 1 %
Eosinophils Absolute: 0.8 10*3/uL — ABNORMAL HIGH (ref 0.0–0.7)
Eosinophils Relative: 7 %
HCT: 42.6 % (ref 39.0–52.0)
HEMOGLOBIN: 14.4 g/dL (ref 13.0–17.0)
LYMPHS ABS: 3.7 10*3/uL (ref 0.7–4.0)
LYMPHS PCT: 35 %
MCH: 30.3 pg (ref 26.0–34.0)
MCHC: 33.8 g/dL (ref 30.0–36.0)
MCV: 89.7 fL (ref 78.0–100.0)
Monocytes Absolute: 1.1 10*3/uL — ABNORMAL HIGH (ref 0.1–1.0)
Monocytes Relative: 10 %
NEUTROS ABS: 5.1 10*3/uL (ref 1.7–7.7)
NEUTROS PCT: 47 %
PLATELETS: 368 10*3/uL (ref 150–400)
RBC: 4.75 MIL/uL (ref 4.22–5.81)
RDW: 13.6 % (ref 11.5–15.5)
WBC: 10.8 10*3/uL — AB (ref 4.0–10.5)

## 2016-01-15 LAB — HEMOGLOBIN A1C
Hgb A1c MFr Bld: 7.2 % — ABNORMAL HIGH (ref 4.8–5.6)
Mean Plasma Glucose: 160 mg/dL

## 2016-01-15 LAB — TSH: TSH: 1.748 u[IU]/mL (ref 0.350–4.500)

## 2016-01-15 LAB — VITAMIN D 25 HYDROXY (VIT D DEFICIENCY, FRACTURES): VIT D 25 HYDROXY: 22.7 ng/mL — AB (ref 30.0–100.0)

## 2016-01-16 LAB — METHYLMALONIC ACID, SERUM: METHYLMALONIC ACID, QUANTITATIVE: 143 nmol/L (ref 0–378)

## 2016-05-27 ENCOUNTER — Encounter (HOSPITAL_COMMUNITY): Payer: Medicare Other

## 2016-05-27 ENCOUNTER — Encounter (HOSPITAL_COMMUNITY): Payer: Medicare Other | Attending: Oncology | Admitting: Oncology

## 2016-05-27 ENCOUNTER — Encounter (HOSPITAL_COMMUNITY): Payer: Self-pay | Admitting: Oncology

## 2016-05-27 VITALS — BP 111/74 | HR 77 | Temp 97.4°F | Resp 16 | Wt 206.5 lb

## 2016-05-27 DIAGNOSIS — D473 Essential (hemorrhagic) thrombocythemia: Secondary | ICD-10-CM | POA: Diagnosis not present

## 2016-05-27 DIAGNOSIS — Z85118 Personal history of other malignant neoplasm of bronchus and lung: Secondary | ICD-10-CM | POA: Insufficient documentation

## 2016-05-27 DIAGNOSIS — Z720 Tobacco use: Secondary | ICD-10-CM | POA: Diagnosis not present

## 2016-05-27 DIAGNOSIS — F17210 Nicotine dependence, cigarettes, uncomplicated: Secondary | ICD-10-CM | POA: Insufficient documentation

## 2016-05-27 DIAGNOSIS — D75839 Thrombocytosis, unspecified: Secondary | ICD-10-CM

## 2016-05-27 DIAGNOSIS — D72820 Lymphocytosis (symptomatic): Secondary | ICD-10-CM | POA: Diagnosis not present

## 2016-05-27 DIAGNOSIS — Z5189 Encounter for other specified aftercare: Secondary | ICD-10-CM | POA: Insufficient documentation

## 2016-05-27 DIAGNOSIS — D72829 Elevated white blood cell count, unspecified: Secondary | ICD-10-CM

## 2016-05-27 DIAGNOSIS — Z9049 Acquired absence of other specified parts of digestive tract: Secondary | ICD-10-CM | POA: Insufficient documentation

## 2016-05-27 DIAGNOSIS — E119 Type 2 diabetes mellitus without complications: Secondary | ICD-10-CM | POA: Insufficient documentation

## 2016-05-27 LAB — CBC WITH DIFFERENTIAL/PLATELET
BASOS PCT: 1 %
Basophils Absolute: 0.1 10*3/uL (ref 0.0–0.1)
EOS ABS: 1 10*3/uL — AB (ref 0.0–0.7)
Eosinophils Relative: 8 %
HCT: 45 % (ref 39.0–52.0)
HEMOGLOBIN: 15.2 g/dL (ref 13.0–17.0)
Lymphocytes Relative: 36 %
Lymphs Abs: 4.7 10*3/uL — ABNORMAL HIGH (ref 0.7–4.0)
MCH: 30.6 pg (ref 26.0–34.0)
MCHC: 33.8 g/dL (ref 30.0–36.0)
MCV: 90.7 fL (ref 78.0–100.0)
Monocytes Absolute: 1.5 10*3/uL — ABNORMAL HIGH (ref 0.1–1.0)
Monocytes Relative: 11 %
NEUTROS PCT: 44 %
Neutro Abs: 5.8 10*3/uL (ref 1.7–7.7)
Platelets: 384 10*3/uL (ref 150–400)
RBC: 4.96 MIL/uL (ref 4.22–5.81)
RDW: 13.1 % (ref 11.5–15.5)
WBC: 13 10*3/uL — AB (ref 4.0–10.5)

## 2016-05-27 NOTE — Patient Instructions (Addendum)
[place] at [facility] Discharge Instructions  RECOMMENDATIONS MADE BY THE CONSULTANT AND ANY TEST RESULTS WILL BE SENT TO YOUR REFERRING PHYSICIAN.  You saw Kirby Crigler, PA-C, today. Follow up and labs in 6 months. See Amy at checkout for appointments.  Thank you for choosing [place] at [facility] to provide your oncology and hematology care.  To afford each patient quality time with our provider, please arrive at least 15 minutes before your scheduled appointment time.    If you have a lab appointment with the [facility] please come in thru the  Main Entrance and check in at the main information desk  You need to re-schedule your appointment should you arrive 10 or more minutes late.  We strive to give you quality time with our providers, and arriving late affects you and other patients whose appointments are after yours.  Also, if you no show three or more times for appointments you may be dismissed from the clinic at the providers discretion.     Again, thank you for choosing [facility].  Our hope is that these requests will decrease the amount of time that you wait before being seen by our physicians.       _____________________________________________________________  Should you have questions after your visit to [facility], please contact our office at [phone] between the hours of 8:30 a.m. and 4:30 p.m.  Voicemails left after 4:30 p.m. will not be returned until the following business day.  For prescription refill requests, have your pharmacy contact our office.       Resources For Cancer Patients and their Caregivers ? American Cancer Society: Can assist with transportation, wigs, general needs, runs Look Good Feel Better.        207 701 0707 ? Cancer Care: Provides financial assistance, online support groups, medication/co-pay assistance.  1-800-813-HOPE (908)025-1726) ? [place] Assists [place] cancer patients and their families through emotional , educational and financial support.  706-484-7689 ? [place] DSS Where to apply for food stamps, Medicaid and utility assistance. 463-800-6996 ? RCATS: Transportation to medical appointments. 219-791-9950 ? Social Security Administration: May apply for disability if have a Stage IV cancer. (279)630-7789 705-550-7994 ? [employer], Disability and Transit Services: Assists with nutrition, care and transit needs. [place] Support Programs: @10RELATIVEDAYS @ > Cancer Support Group  2nd Tuesday of the month 1pm-2pm, Journey Room  > Creative Journey  3rd Tuesday of the month 1130am-1pm, Journey Room  > Look Good Feel Better  1st Wednesday of the month 10am-12 noon, Journey Room (Call [place] to register [phone])

## 2016-05-27 NOTE — Assessment & Plan Note (Signed)
Minimal-mild thrombocytosis dating back to at least 2016 (April).  Highest documented platelet count is 454,000.  This too is likely reactive with negative peripheral work-up and bone marrow aspiration and biopsy on 11/06/2015.

## 2016-05-27 NOTE — Progress Notes (Signed)
Renee Rival, NP [address] [place] [address]  Lymphocytosis - Plan: Pathologist smear review, CBC with Differential  Thrombocytosis ([place])  CURRENT THERAPY: Observation  INTERVAL HISTORY: Logan Carey 66 y.o. male returns for followup of Leukocytosis with lymphocytosis, monocytosis, and eosinophilia dating back to at least 03/28/2015 with a minimal- mild thrombocytosis and normal HGB/HCT/RBC.   He is doing well.  He denies any B symptoms.  Appetite is good.  Weight is stable.  He denies any new lumps or bumps.  He denies any abdominal pain or early satiety.  Review of Systems  Constitutional: Negative.  Negative for chills, fever, malaise/fatigue and weight loss.  HENT: Negative.   Eyes: Negative.   Respiratory: Negative.  Negative for cough.   Cardiovascular: Negative.  Negative for chest pain.  Gastrointestinal: Negative.  Negative for abdominal pain, constipation, diarrhea, nausea and vomiting.  Genitourinary: Negative.   Musculoskeletal: Negative.   Skin: Negative.  Negative for rash.  Neurological: Negative.  Negative for weakness.  Endo/Heme/Allergies: Negative.   Psychiatric/Behavioral: Negative.     Past Medical History:  Diagnosis Date  . Diabetes mellitus without complication ([place])   . High cholesterol   . Hypertension   . Leukocytosis 10/01/2015  . Lymphocytosis 10/01/2015  . Thrombocytosis ([place]) 10/01/2015    Past Surgical History:  Procedure Laterality Date  . CHOLECYSTECTOMY      Family History  Problem Relation Age of Onset  . Stroke Father   . Cancer Father 61    Lung cancer-smoker  . Hypertension Sister   . Hypertension Brother   . Hyperlipidemia Son     Social History   Social History  . Marital status: Married    Spouse name: N/A  . Number of children: N/A  . Years of education: N/A   Social History Main Topics  . Smoking status: Current Every Day Smoker    Years: 50.00    Types: Cigarettes  . Smokeless  tobacco: Never Used  . Alcohol use No     Comment: Use to drink 1/2 of a 5th of [employer] every weekend quitting in ~ 1977  . Drug use: No  . Sexual activity: Not Asked   Other Topics Concern  . None   Social History Narrative  . None     PHYSICAL EXAMINATION  ECOG PERFORMANCE STATUS: 0 - Asymptomatic  Vitals:   05/27/16 1055  BP: 111/74  Pulse: 77  Resp: 16  Temp: 97.4 F (36.3 C)    GENERAL:alert, healthy, no distress, well nourished, well developed, comfortable, cooperative, obese, smiling and accompanied by his wife. SKIN: skin color, texture, turgor are normal, no rashes or significant lesions HEAD: Normocephalic, No masses, lesions, tenderness or abnormalities EYES: normal, EOMI, Conjunctiva are pink and non-injected EARS: External ears normal OROPHARYNX:lips, buccal mucosa, and tongue normal and mucous membranes are moist  NECK: supple, trachea midline LYMPH:  no palpable lymphadenopathy BREAST:not examined LUNGS: clear to auscultation  HEART: regular rate & rhythm ABDOMEN:abdomen soft and normal bowel sounds BACK: Back symmetric, no curvature. EXTREMITIES:less then 2 second capillary refill, no joint deformities, effusion, or inflammation, no skin discoloration, no cyanosis  NEURO: alert & oriented x 3 with fluent speech, no focal motor/sensory deficits, gait normal   LABORATORY DATA: CBC    Component Value Date/Time   WBC 13.0 (H) 05/27/2016 0951   RBC 4.96 05/27/2016 0951   HGB 15.2 05/27/2016 0951   HCT 45.0 05/27/2016 0951   PLT 384 05/27/2016  0951   MCV 90.7 05/27/2016 0951   MCH 30.6 05/27/2016 0951   MCHC 33.8 05/27/2016 0951   RDW 13.1 05/27/2016 0951   LYMPHSABS 4.7 (H) 05/27/2016 0951   MONOABS 1.5 (H) 05/27/2016 0951   EOSABS 1.0 (H) 05/27/2016 0951   BASOSABS 0.1 05/27/2016 0951      Chemistry      Component Value Date/Time   NA 136 01/14/2016 1321   K 3.9 01/14/2016 1321   CL 105 01/14/2016 1321   CO2 24 01/14/2016 1321   BUN 15  01/14/2016 1321   CREATININE 0.89 01/14/2016 1321      Component Value Date/Time   CALCIUM 9.4 01/14/2016 1321   ALKPHOS 67 01/14/2016 1321   AST 25 01/14/2016 1321   ALT 31 01/14/2016 1321   BILITOT 0.3 01/14/2016 1321     Lab Results  Component Value Date   ESRSEDRATE 13 10/01/2015   Lab Results  Component Value Date   CRP 0.6 10/01/2015     PENDING LABS:   RADIOGRAPHIC STUDIES:  No results found.   PATHOLOGY:  Diagnosis Bone Marrow, Aspirate,Biopsy, and Clot - HYPERCELLULAR BONE MARROW FOR AGE WITH TRILINEAGE HEMATOPOIESIS. - MILD POLYCLONAL PLASMACYTOSIS. - SEE COMMENT. PERIPHERAL BLOOD: - LEUKOCYTOSIS. - THROMBOCYTOSIS. Diagnosis Note The bone marrow is slightly hypercellular for age with trilineage hematopoiesis including abundant megakaryocytes some of which display abnormal morphology with large and hyperchromatic cells. Significant morphologic changes in other myeloid cell lines are not seen and no increase in blastic cells is identified. The myeloid changes are limited and not considered specific or diagnostic of a myeloproliferative neoplasm especially in view of negative JAK-2 and Calreticulin testing. Nonetheless, clinical follow up and correlation with cytogenetic studies is recommended. The plasma cells are mildly increased in number representing 6%of all cells and display polyclonal staining pattern for kappa and lambda light chains and hence are likely reactive in nature. (BNS:ecj 11/08/2015) Susanne Greenhouse MD Pathologist, Electronic Signature (Case signed 11/08/2015)    ASSESSMENT AND PLAN:  Lymphocytosis Leukocytosis with lymphocytosis, monocytosis, and eosinophilia dating back to at least 03/28/2015 with a mild thrombocytosis and normal HGB/HCT/RBC.  Peripheral work-up has been negative including normal inflammatory markers (ESR/CRP), negative BCR/ABL, negative JAK2, CALR, and MPL mutations, and negative peripheral FLOW cytometry.  As a result,  the patient opted for a bone marrow aspiration and biopsy.  Pathology is negative for any primary bone marrow issue and findings demonstrate a reactive-like process.  I personally reviewed and went over pathology results with the patient.  Labs today:  CBC diff.  I personally reviewed and went over laboratory results with the patient.  The results are noted within this dictation.  Leukocytosis with lymphocytosis, monocytosis, and eosinophilia is noted.  Stable.  I reviewed the potential causes of leukocytosis (specifically mild neutrophilia) including but not limited to: ?Any active inflammatory condition or infection ?Cigarette smoking, which may be the most common cause of mild neutrophilia ?Previously diagnosed hematologic disease (such as acute and chronic leukemias, chronic myeloproliferative or myelodysplastic disease) ?The presence of, and treatment for, a chronic anxiety state, panic disorder, rage, or emotional stress (eg, posttraumatic stress disorder, depression) ?Presence of non-hematologic diseases known to increase neutrophil counts (eg, eclampsia, thyroid storm, hypercortisolism). ?Prior splenectomy or known asplenia ?Positive family history of neutrophilia ?Recent vaccination  ?Medications - Various medications may cause neutrophilia. However,  such cases are rare and appear in the literature as isolated case reports.  Hematologic work-up is negative and complete at this time, including a bone  marrow aspiration and biopsy; all of which is negative for primary bone marrow issue.  Labs in 6 months: CBC diff, path smear review.  Return in 6 months for follow-up.  If labs are stable, will consider moving appointments out to yearly for 1-2 more years.  If all is well, then we would consider releasing patient from the clinic.  Thrombocytosis ([place]) Minimal-mild thrombocytosis dating back to at least 2016 (April).  Highest documented platelet count is 454,000.  This too is likely  reactive with negative peripheral work-up and bone marrow aspiration and biopsy on 11/06/2015.   ORDERS PLACED FOR THIS ENCOUNTER: Orders Placed This Encounter  Procedures  . Pathologist smear review  . CBC with Differential    MEDICATIONS PRESCRIBED THIS ENCOUNTER: No orders of the defined types were placed in this encounter.   THERAPY PLAN:  Continued surveillance.  All questions were answered. The patient knows to call the clinic with any problems, questions or concerns. We can certainly see the patient much sooner if necessary.  Patient and plan discussed with Dr. Ancil Linsey and she is in agreement with the aforementioned.   This note is electronically signed by: Doy Mince 05/27/2016 11:21 AM

## 2016-05-27 NOTE — Assessment & Plan Note (Addendum)
Leukocytosis with lymphocytosis, monocytosis, and eosinophilia dating back to at least 03/28/2015 with a mild thrombocytosis and normal HGB/HCT/RBC.  Peripheral work-up has been negative including normal inflammatory markers (ESR/CRP), negative BCR/ABL, negative JAK2, CALR, and MPL mutations, and negative peripheral FLOW cytometry.  As a result, the patient opted for a bone marrow aspiration and biopsy.  Pathology is negative for any primary bone marrow issue and findings demonstrate a reactive-like process.  I personally reviewed and went over pathology results with the patient.  Labs today:  CBC diff.  I personally reviewed and went over laboratory results with the patient.  The results are noted within this dictation.  Leukocytosis with lymphocytosis, monocytosis, and eosinophilia is noted.  Stable.  I reviewed the potential causes of leukocytosis (specifically mild neutrophilia) including but not limited to: ?Any active inflammatory condition or infection ?Cigarette smoking, which may be the most common cause of mild neutrophilia ?Previously diagnosed hematologic disease (such as acute and chronic leukemias, chronic myeloproliferative or myelodysplastic disease) ?The presence of, and treatment for, a chronic anxiety state, panic disorder, rage, or emotional stress (eg, posttraumatic stress disorder, depression) ?Presence of non-hematologic diseases known to increase neutrophil counts (eg, eclampsia, thyroid storm, hypercortisolism). ?Prior splenectomy or known asplenia ?Positive family history of neutrophilia ?Recent vaccination  ?Medications - Various medications may cause neutrophilia. However,  such cases are rare and appear in the literature as isolated case reports.  Hematologic work-up is negative and complete at this time, including a bone marrow aspiration and biopsy; all of which is negative for primary bone marrow issue.  Labs in 6 months: CBC diff, path smear review.  Return in 6  months for follow-up.  If labs are stable, will consider moving appointments out to yearly for 1-2 more years.  If all is well, then we would consider releasing patient from the clinic.

## 2016-11-26 NOTE — Assessment & Plan Note (Addendum)
Leukocytosis with lymphocytosis, monocytosis, and eosinophilia dating back to at least 03/28/2015 with a minimal-mild thrombocytosis and normal hemoglobin/hematocrit/RBC.  This in the setting of tobacco abuse which is likely the cause of elevated WBC.  Peripheral workup has been negative including normal inflammatory markers, negative BCR/ABL, negative Jak 2, CA LR, and MPL mutations, and negative peripheral flow cytometry.  Therefore, the patient opted for a bone marrow aspiration and biopsy.  Pathology from this is negative and findings demonstrated a reactive-like process.  Labs today: CBC diff, pathology smear review.  I personally reviewed and went over laboratory results with the patient.  The results are noted within this dictation.  I reviewed the potential causes of leukocytosis (specifically mild neutrophilia) including but not limited to: ?Any active inflammatory condition or infection ?Cigarette smoking, which may be the most common cause of mild neutrophilia ?Previously diagnosed hematologic disease (such as acute and chronic leukemias, chronic myeloproliferative or myelodysplastic disease) ?The presence of, and treatment for, a chronic anxiety state, panic disorder, rage, or emotional stress (eg, posttraumatic stress disorder, depression) ?Presence of non-hematologic diseases known to increase neutrophil counts (eg, eclampsia, thyroid storm, hypercortisolism). ?Prior splenectomy or known asplenia ?Positive family history of neutrophilia ?Recent vaccination  ?Medications - Various medications may cause neutrophilia. However,  such cases are rare and appear in the literature as isolated case reports.  Hematology workup is negative and complete at this time; including a bone marrow aspiration and biopsy.  All testing has been negative for any primary bone marrow issue.    Labs in 12 months: CBC diff, pathology smear review.  Smoking cessation education provided.  Return in 12 months  for ongoing follow-up.  If stability of counts persist, can consider releasing the patient from the clinic with regular follow-up by primary care provider at that time.

## 2016-11-26 NOTE — Progress Notes (Signed)
Renee Rival, NP [address] [place] [address]  Lymphocytosis - Plan: CBC with Differential, Pathologist smear review  CURRENT THERAPY: Observation  INTERVAL HISTORY: Logan Carey 66 y.o. male returns for followup of leukocytosis with lymphocytosis, monocytosis, and eosinophilia dating back to at least 03/28/2015 with a minimal-mild thrombocytosis and normal hemoglobin/hematocrit/RBC.  HPI Elements   Location:  Blood  Quality:  Leukocytosis, lymphocytosis  Severity:  Mild  Duration:  Since at least 03/28/2015  Context:  Preservation of hemoglobin.  Timing:   Modifying Factors:  Mild thrombocytosis  Associated Signs & Symptoms:  Asymptomatic   He denies any B symptoms.  His weight is stable.  His appetite is stable.  His energy level is 100%.  He denies any new pain.  She denies any hospitalizations or excessive antibiotic needs over the last 6 months.  Review of Systems  Constitutional: Negative.  Negative for chills, fever and weight loss.  HENT: Negative.   Eyes: Negative.   Respiratory: Negative.  Negative for cough.   Cardiovascular: Negative.  Negative for chest pain.  Gastrointestinal: Negative.  Negative for blood in stool, constipation, diarrhea, melena, nausea and vomiting.  Genitourinary: Negative.   Musculoskeletal: Negative.   Skin: Negative.   Neurological: Negative.  Negative for weakness.  Endo/Heme/Allergies: Negative.   Psychiatric/Behavioral: Negative.     Past Medical History:  Diagnosis Date  . Diabetes mellitus without complication ([place])   . High cholesterol   . Hypertension   . Leukocytosis 10/01/2015  . Lymphocytosis 10/01/2015  . Thrombocytosis ([place]) 10/01/2015    Past Surgical History:  Procedure Laterality Date  . CHOLECYSTECTOMY      Family History  Problem Relation Age of Onset  . Stroke Father   . Cancer Father 85       Lung cancer-smoker  . Hypertension Sister   . Hypertension Brother   .  Hyperlipidemia Son     Social History   Social History  . Marital status: Married    Spouse name: N/A  . Number of children: N/A  . Years of education: N/A   Social History Main Topics  . Smoking status: Current Every Day Smoker    Years: 50.00    Types: Cigarettes  . Smokeless tobacco: Never Used  . Alcohol use No     Comment: Use to drink 1/2 of a 5th of [employer] every weekend quitting in ~ 1977  . Drug use: No  . Sexual activity: Not Asked   Other Topics Concern  . None   Social History Narrative  . None     PHYSICAL EXAMINATION  ECOG PERFORMANCE STATUS: 0 - Asymptomatic  Vitals:   11/27/16 1024  BP: (!) 153/86  Pulse: 72  Resp: 18  Temp: 97.8 F (36.6 C)    GENERAL:alert, healthy, no distress, well nourished, well developed, comfortable, cooperative, obese, smiling and unaccompanied SKIN: skin color, texture, turgor are normal, no rashes or significant lesions HEAD: Normocephalic, No masses, lesions, tenderness or abnormalities EYES: normal, EOMI, Conjunctiva are pink and non-injected EARS: External ears normal OROPHARYNX:lips, buccal mucosa, and tongue normal and mucous membranes are moist  NECK: supple, no adenopathy, trachea midline LYMPH:  no palpable lymphadenopathy BREAST:not examined LUNGS: clear to auscultation  HEART: regular rate & rhythm, no murmurs and no gallops ABDOMEN:abdomen soft, obese and normal bowel sounds BACK: Back symmetric, no curvature. EXTREMITIES:less then 2 second capillary refill, no joint deformities, effusion, or inflammation, no edema, no skin discoloration, no cyanosis  NEURO: alert & oriented x 3 with fluent speech, no focal motor/sensory deficits, gait normal   LABORATORY DATA: CBC    Component Value Date/Time   WBC 12.5 (H) 11/27/2016 0917   RBC 5.24 11/27/2016 0917   HGB 16.0 11/27/2016 0917   HCT 47.1 11/27/2016 0917   PLT 395 11/27/2016 0917   MCV 89.9 11/27/2016 0917   MCH 30.5 11/27/2016 0917   MCHC 34.0  11/27/2016 0917   RDW 13.8 11/27/2016 0917   LYMPHSABS 4.1 (H) 11/27/2016 0917   MONOABS 1.3 (H) 11/27/2016 0917   EOSABS 0.7 11/27/2016 0917   BASOSABS 0.1 11/27/2016 0917      Chemistry      Component Value Date/Time   NA 136 01/14/2016 1321   K 3.9 01/14/2016 1321   CL 105 01/14/2016 1321   CO2 24 01/14/2016 1321   BUN 15 01/14/2016 1321   CREATININE 0.89 01/14/2016 1321      Component Value Date/Time   CALCIUM 9.4 01/14/2016 1321   ALKPHOS 67 01/14/2016 1321   AST 25 01/14/2016 1321   ALT 31 01/14/2016 1321   BILITOT 0.3 01/14/2016 1321        PENDING LABS:   RADIOGRAPHIC STUDIES:  No results found.   PATHOLOGY:    ASSESSMENT AND PLAN:  Lymphocytosis Leukocytosis with lymphocytosis, monocytosis, and eosinophilia dating back to at least 03/28/2015 with a minimal-mild thrombocytosis and normal hemoglobin/hematocrit/RBC.  This in the setting of tobacco abuse which is likely the cause of elevated WBC.  Peripheral workup has been negative including normal inflammatory markers, negative BCR/ABL, negative Jak 2, CA LR, and MPL mutations, and negative peripheral flow cytometry.  Therefore, the patient opted for a bone marrow aspiration and biopsy.  Pathology from this is negative and findings demonstrated a reactive-like process.  Labs today: CBC diff, pathology smear review.  I personally reviewed and went over laboratory results with the patient.  The results are noted within this dictation.  I reviewed the potential causes of leukocytosis (specifically mild neutrophilia) including but not limited to: ?Any active inflammatory condition or infection ?Cigarette smoking, which may be the most common cause of mild neutrophilia ?Previously diagnosed hematologic disease (such as acute and chronic leukemias, chronic myeloproliferative or myelodysplastic disease) ?The presence of, and treatment for, a chronic anxiety state, panic disorder, rage, or emotional stress (eg,  posttraumatic stress disorder, depression) ?Presence of non-hematologic diseases known to increase neutrophil counts (eg, eclampsia, thyroid storm, hypercortisolism). ?Prior splenectomy or known asplenia ?Positive family history of neutrophilia ?Recent vaccination  ?Medications - Various medications may cause neutrophilia. However,  such cases are rare and appear in the literature as isolated case reports.  Hematology workup is negative and complete at this time; including a bone marrow aspiration and biopsy.  All testing has been negative for any primary bone marrow issue.    Labs in 12 months: CBC diff, pathology smear review.  Smoking cessation education provided.  Return in 12 months for ongoing follow-up.  If stability of counts persist, can consider releasing the patient from the clinic with regular follow-up by primary care provider at that time.   ORDERS PLACED FOR THIS ENCOUNTER: Orders Placed This Encounter  Procedures  . CBC with Differential  . Pathologist smear review    MEDICATIONS PRESCRIBED THIS ENCOUNTER: Meds ordered this encounter  Medications  . buPROPion (WELLBUTRIN XL) 300 MG 24 hr tablet  . gabapentin (NEURONTIN) 400 MG capsule  . hydrochlorothiazide (HYDRODIURIL) 25 MG tablet    THERAPY PLAN:  Will monitor labs for stability of leukocytosis  All questions were answered. The patient knows to call the clinic with any problems, questions or concerns. We can certainly see the patient much sooner if necessary.  Patient and plan discussed with Dr. Twana First and she is in agreement with the aforementioned.   This note is electronically signed by: Doy Mince 11/27/2016 11:04 AM

## 2016-11-27 ENCOUNTER — Other Ambulatory Visit (HOSPITAL_COMMUNITY): Payer: Medicare Other

## 2016-11-27 ENCOUNTER — Encounter (HOSPITAL_BASED_OUTPATIENT_CLINIC_OR_DEPARTMENT_OTHER): Payer: Medicare Other | Admitting: Oncology

## 2016-11-27 ENCOUNTER — Encounter (HOSPITAL_COMMUNITY): Payer: Medicare Other | Attending: Oncology

## 2016-11-27 ENCOUNTER — Ambulatory Visit (HOSPITAL_COMMUNITY): Payer: Medicare Other | Admitting: Oncology

## 2016-11-27 ENCOUNTER — Encounter (HOSPITAL_COMMUNITY): Payer: Self-pay | Admitting: Oncology

## 2016-11-27 DIAGNOSIS — D72820 Lymphocytosis (symptomatic): Secondary | ICD-10-CM

## 2016-11-27 DIAGNOSIS — Z720 Tobacco use: Secondary | ICD-10-CM

## 2016-11-27 LAB — CBC WITH DIFFERENTIAL/PLATELET
BASOS ABS: 0.1 10*3/uL (ref 0.0–0.1)
Basophils Relative: 0 %
Eosinophils Absolute: 0.7 10*3/uL (ref 0.0–0.7)
Eosinophils Relative: 5 %
HEMATOCRIT: 47.1 % (ref 39.0–52.0)
Hemoglobin: 16 g/dL (ref 13.0–17.0)
LYMPHS ABS: 4.1 10*3/uL — AB (ref 0.7–4.0)
LYMPHS PCT: 33 %
MCH: 30.5 pg (ref 26.0–34.0)
MCHC: 34 g/dL (ref 30.0–36.0)
MCV: 89.9 fL (ref 78.0–100.0)
MONO ABS: 1.3 10*3/uL — AB (ref 0.1–1.0)
MONOS PCT: 10 %
Neutro Abs: 6.4 10*3/uL (ref 1.7–7.7)
Neutrophils Relative %: 52 %
PLATELETS: 395 10*3/uL (ref 150–400)
RBC: 5.24 MIL/uL (ref 4.22–5.81)
RDW: 13.8 % (ref 11.5–15.5)
WBC: 12.5 10*3/uL — ABNORMAL HIGH (ref 4.0–10.5)

## 2016-11-27 NOTE — Patient Instructions (Signed)
[place] at [facility] Discharge Instructions  RECOMMENDATIONS MADE BY THE CONSULTANT AND ANY TEST RESULTS WILL BE SENT TO YOUR REFERRING PHYSICIAN.  You were seen today by Logan Carey Follow up in 1 year with labs   Thank you for choosing [place] at [facility] to provide your oncology and hematology care.  To afford each patient quality time with our provider, please arrive at least 15 minutes before your scheduled appointment time.    If you have a lab appointment with the [facility] please come in thru the  Main Entrance and check in at the main information desk  You need to re-schedule your appointment should you arrive 10 or more minutes late.  We strive to give you quality time with our providers, and arriving late affects you and other patients whose appointments are after yours.  Also, if you no show three or more times for appointments you may be dismissed from the clinic at the providers discretion.     Again, thank you for choosing [facility].  Our hope is that these requests will decrease the amount of time that you wait before being seen by our physicians.       _____________________________________________________________  Should you have questions after your visit to [facility], please contact our office at [phone] between the hours of 8:30 a.m. and 4:30 p.m.  Voicemails left after 4:30 p.m. will not be returned until the following business day.  For prescription refill requests, have your pharmacy contact our office.       Resources For Cancer Patients and their Caregivers ? American Cancer Society: Can assist with transportation, wigs, general needs, runs Look Good Feel Better.        209-761-8832 ? Cancer Care: Provides financial assistance, online support groups, medication/co-pay assistance.  1-800-813-HOPE 6017902767) ? [place] Assists [place] cancer patients and their families through emotional , educational and financial support.  916-253-1071 ? [place] DSS Where to apply for food stamps, Medicaid and utility assistance. (580) 698-5041 ? [facility]: Transportation to medical appointments. 830-850-0107 ? Social Security Administration: May apply for disability if have a Stage IV cancer. 706-229-6868 (601)581-1953 ? [employer], Disability and Transit Services: Assists with nutrition, care and transit needs. [place] Support Programs: @10RELATIVEDAYS @ > Cancer Support Group  2nd Tuesday of the month 1pm-2pm, Journey Room  > Creative Journey  3rd Tuesday of the month 1130am-1pm, Journey Room  > Look Good Feel Better  1st Wednesday of the month 10am-12 noon, Journey Room (Call [place] to register [phone])

## 2017-11-27 ENCOUNTER — Encounter (HOSPITAL_COMMUNITY): Payer: Self-pay | Admitting: Internal Medicine

## 2017-11-27 ENCOUNTER — Inpatient Hospital Stay (HOSPITAL_COMMUNITY): Payer: Medicare Other | Attending: Hematology

## 2017-11-27 ENCOUNTER — Inpatient Hospital Stay (HOSPITAL_COMMUNITY): Payer: Medicare Other | Attending: Internal Medicine | Admitting: Internal Medicine

## 2017-11-27 VITALS — BP 132/75 | HR 71 | Temp 97.6°F | Resp 14 | Wt 190.3 lb

## 2017-11-27 DIAGNOSIS — D72820 Lymphocytosis (symptomatic): Secondary | ICD-10-CM | POA: Insufficient documentation

## 2017-11-27 DIAGNOSIS — D721 Eosinophilia: Secondary | ICD-10-CM | POA: Diagnosis not present

## 2017-11-27 DIAGNOSIS — D72821 Monocytosis (symptomatic): Secondary | ICD-10-CM | POA: Diagnosis not present

## 2017-11-27 DIAGNOSIS — I10 Essential (primary) hypertension: Secondary | ICD-10-CM | POA: Diagnosis not present

## 2017-11-27 DIAGNOSIS — Z720 Tobacco use: Secondary | ICD-10-CM | POA: Insufficient documentation

## 2017-11-27 DIAGNOSIS — IMO0001 Reserved for inherently not codable concepts without codable children: Secondary | ICD-10-CM

## 2017-11-27 DIAGNOSIS — F17200 Nicotine dependence, unspecified, uncomplicated: Secondary | ICD-10-CM

## 2017-11-27 LAB — CBC WITH DIFFERENTIAL/PLATELET
BASOS ABS: 0.1 10*3/uL (ref 0.0–0.1)
Basophils Relative: 1 %
EOS ABS: 0.6 10*3/uL (ref 0.0–0.7)
EOS PCT: 6 %
HCT: 47 % (ref 39.0–52.0)
HEMOGLOBIN: 15.8 g/dL (ref 13.0–17.0)
Lymphocytes Relative: 30 %
Lymphs Abs: 3.1 10*3/uL (ref 0.7–4.0)
MCH: 31.5 pg (ref 26.0–34.0)
MCHC: 33.6 g/dL (ref 30.0–36.0)
MCV: 93.6 fL (ref 78.0–100.0)
Monocytes Absolute: 1.1 10*3/uL — ABNORMAL HIGH (ref 0.1–1.0)
Monocytes Relative: 10 %
NEUTROS PCT: 53 %
Neutro Abs: 5.4 10*3/uL (ref 1.7–7.7)
Platelets: 360 10*3/uL (ref 150–400)
RBC: 5.02 MIL/uL (ref 4.22–5.81)
RDW: 13.9 % (ref 11.5–15.5)
WBC: 10.2 10*3/uL (ref 4.0–10.5)

## 2017-11-27 LAB — PATHOLOGIST SMEAR REVIEW

## 2017-11-27 NOTE — Progress Notes (Signed)
Diagnosis Lymphocytosis  Smoking - Plan: CBC with Differential/Platelet, Comprehensive metabolic panel, Lactate dehydrogenase, Ambulatory Referral for Lung Cancer Screening  Staging Cancer Staging No matching staging information was found for the patient.  Assessment and Plan:  1.  Leukocytosis with lymphocytosis, monocytosis, and eosinophilia dating back to at least 03/28/2015 with mild thrombocytosis and normal hemoglobin.   Pt is a smoker which may also have contributed to WBC elevations.    Pt is here to go over labs that were done 11/27/2017 and showed WBC 10.2 HB 15.8 plts 360,000.  I have discussed with pt labs are normal.  He will be seen for follow-up in 1 year with repeat labs.    2.  Smoking/Respiratory problems.  Cessation is recommended.  Pulse ox is 97% on room air.   He is referred to lung cancer screening clinic.  He has a greater than 30 pack yr history of smoking.  If respiratory problems continue he will be referred to pulmonary for evaluation.    3  HTN.  BP is 132/75.  Follow-up with PCP  4  Health maintenance.  Pt should continue follow-up with PCP and GI as recommended.     Interval History: 67 yr old male who has undergone workup has been negative including normal inflammatory markers, negative BCR/ABL, negative Jak 2, CA LR, and MPL mutations, and negative peripheral flow cytometry.  Patient underwent bone marrow aspiration and biopsy on 11/06/2015 that showed:     Diagnosis Bone Marrow, Aspirate,Biopsy, and Clot - HYPERCELLULAR BONE MARROW FOR AGE WITH TRILINEAGE HEMATOPOIESIS. - MILD POLYCLONAL PLASMACYTOSIS. - SEE COMMENT. PERIPHERAL BLOOD: - LEUKOCYTOSIS. - THROMBOCYTOSIS. Diagnosis Note The bone marrow is slightly hypercellular for age with trilineage hematopoiesis including abundant megakaryocytes some of which display abnormal morphology with large and hyperchromatic cells. Significant morphologic changes in other myeloid cell lines are not seen and no  increase in blastic cells is identified. The myeloid changes are limited and not considered specific or diagnostic of a myeloproliferative neoplasm especially in view of negative JAK-2 and Calreticulin testing. Nonetheless, clinical follow up and correlation with cytogenetic studies is recommended. The plasma cells are mildly increased in number representing 6%of all cells and display polyclonal staining pattern for kappa and lambda light chains and hence are likely reactive in nature. .  Pathology from this is negative and findings demonstrated a reactive-like process.  Current Status:  Pt is seen today for follow-up.  He is here to go over labs.  When questioned, he continues to smoke.    Problem List Patient Active Problem List   Diagnosis Date Noted  . Lymphocytosis [D72.820] 10/01/2015  . Thrombocytosis ([place]) [D47.3] 10/01/2015    Past Medical History Past Medical History:  Diagnosis Date  . Diabetes mellitus without complication ([place])   . High cholesterol   . Hypertension   . Leukocytosis 10/01/2015  . Lymphocytosis 10/01/2015  . Thrombocytosis ([place]) 10/01/2015    Past Surgical History Past Surgical History:  Procedure Laterality Date  . CHOLECYSTECTOMY      Family History Family History  Problem Relation Age of Onset  . Stroke Father   . Cancer Father 77       Lung cancer-smoker  . Hypertension Sister   . Hypertension Brother   . Hyperlipidemia Son      Social History  reports that he has been smoking cigarettes.  He has smoked for the past 50.00 years. He has never used smokeless tobacco. He reports that he does not drink alcohol or use  drugs.  Medications  Current Outpatient Medications:  .  aspirin 81 MG tablet, Take 81 mg by mouth daily., Disp: , Rfl:  .  benazepril (LOTENSIN) 20 MG tablet, Take 20 mg by mouth 2 (two) times daily., Disp: , Rfl:  .  buPROPion (WELLBUTRIN XL) 300 MG 24 hr tablet, , Disp: , Rfl:  .  diazepam (VALIUM) 5 MG tablet, Take 5 mg by  mouth 3 (three) times daily., Disp: , Rfl:  .  fenofibrate 160 MG tablet, Take 160 mg by mouth daily., Disp: , Rfl:  .  gabapentin (NEURONTIN) 400 MG capsule, , Disp: , Rfl:  .  HUMALOG KWIKPEN 100 UNIT/ML KiwkPen, Inject 35 Units into the skin 3 (three) times daily., Disp: , Rfl:  .  hydrochlorothiazide (HYDRODIURIL) 25 MG tablet, , Disp: , Rfl:  .  insulin detemir (LEVEMIR) 100 UNIT/ML injection, Inject 70 Units into the skin at bedtime., Disp: , Rfl:  .  metFORMIN (GLUCOPHAGE-XR) 500 MG 24 hr tablet, Take 500 mg by mouth. 4 tabs daily with supper, Disp: , Rfl:  .  NOVOTWIST 32G X 5 MM MISC, , Disp: , Rfl:  .  ONE TOUCH ULTRA TEST test strip, , Disp: , Rfl:  .  oxyCODONE-acetaminophen (PERCOCET) 10-325 MG tablet, Take 10-325 tablets by mouth every 8 (eight) hours as needed., Disp: , Rfl:  .  pravastatin (PRAVACHOL) 20 MG tablet, Take 20 mg by mouth at bedtime., Disp: , Rfl:  .  tiZANidine (ZANAFLEX) 4 MG tablet, , Disp: , Rfl:  .  topiramate (TOPAMAX) 50 MG tablet, Take 60 mg by mouth 2 (two) times daily. Patient reports topamax has recently changed to 60 mg twice daily, Disp: , Rfl:   Allergies Patient has no known allergies.  Review of Systems Review of Systems - Oncology ROS negative other than respiratory problems.     Physical Exam  Vitals Wt Readings from Last 3 Encounters:  11/27/17 190 lb 4.8 oz (86.3 kg)  11/27/16 196 lb 11.2 oz (89.2 kg)  05/27/16 206 lb 8 oz (93.7 kg)   Temp Readings from Last 3 Encounters:  11/27/17 97.6 F (36.4 C) (Oral)  11/27/16 97.8 F (36.6 C) (Oral)  05/27/16 97.4 F (36.3 C) (Oral)   BP Readings from Last 3 Encounters:  11/27/17 132/75  11/27/16 (!) 153/86  05/27/16 111/74   Pulse Readings from Last 3 Encounters:  11/27/17 71  11/27/16 72  05/27/16 77   Constitutional: Well-developed, well-nourished, and in no distress.   HENT: Head: Normocephalic and atraumatic.  Mouth/Throat: No oropharyngeal exudate. Mucosa moist. Eyes:  Pupils are equal, round, and reactive to light. Conjunctivae are normal. No scleral icterus.  Neck: Normal range of motion. Neck supple. No JVD present.  Cardiovascular: Normal rate, regular rhythm and normal heart sounds.  Exam reveals no gallop and no friction rub.   No murmur heard. Pulmonary/Chest: Effort normal.  Coarse BS  Abdominal: Soft. Bowel sounds are normal. No distension. There is no tenderness. There is no guarding.  Musculoskeletal: No edema or tenderness.  Lymphadenopathy: No cervical, axillary or supraclavicular adenopathy.  Neurological: Alert and oriented to person, place, and time. No cranial nerve deficit.  Skin: Skin is warm and dry. No rash noted. No erythema. No pallor.  Psychiatric: Affect and judgment normal.   Labs Appointment on 11/27/2017  Component Date Value Ref Range Status  . WBC 11/27/2017 10.2  4.0 - 10.5 K/uL Final  . RBC 11/27/2017 5.02  4.22 - 5.81 MIL/uL Final  . Hemoglobin  11/27/2017 15.8  13.0 - 17.0 g/dL Final  . HCT 11/27/2017 47.0  39.0 - 52.0 % Final  . MCV 11/27/2017 93.6  78.0 - 100.0 fL Final  . MCH 11/27/2017 31.5  26.0 - 34.0 pg Final  . MCHC 11/27/2017 33.6  30.0 - 36.0 g/dL Final  . RDW 11/27/2017 13.9  11.5 - 15.5 % Final  . Platelets 11/27/2017 360  150 - 400 K/uL Final  . Neutrophils Relative % 11/27/2017 53  % Final  . Neutro Abs 11/27/2017 5.4  1.7 - 7.7 K/uL Final  . Lymphocytes Relative 11/27/2017 30  % Final  . Lymphs Abs 11/27/2017 3.1  0.7 - 4.0 K/uL Final  . Monocytes Relative 11/27/2017 10  % Final  . Monocytes Absolute 11/27/2017 1.1* 0.1 - 1.0 K/uL Final  . Eosinophils Relative 11/27/2017 6  % Final  . Eosinophils Absolute 11/27/2017 0.6  0.0 - 0.7 K/uL Final  . Basophils Relative 11/27/2017 1  % Final  . Basophils Absolute 11/27/2017 0.1  0.0 - 0.1 K/uL Final   Performed at [facility], [address], [place], [place] [address]  . Path Review 11/27/2017 07.12.19   Final   Comment: ESSENTIALY  UNREMARKABLE. Reviewed by Kalman Drape, M.D. Performed at [facility], [place] [address] [place], [place] [address]      Pathology Orders Placed This Encounter  Procedures  . CBC with Differential/Platelet    Standing Status:   Future    Standing Expiration Date:   11/28/2019  . Comprehensive metabolic panel    Standing Status:   Future    Standing Expiration Date:   11/28/2019  . Lactate dehydrogenase    Standing Status:   Future    Standing Expiration Date:   11/28/2019  . Ambulatory Referral for Lung Cancer Screening    Referral Priority:   Routine    Referral Type:   Consultation    Referral Reason:   Specialty Services Required    Number of Visits Requested:   Virgil MD

## 2017-11-27 NOTE — Patient Instructions (Signed)
[facility] Discharge Instructions  You saw Dr. Higgs today.   Thank you for choosing [facility] at [facility] to provide your oncology and hematology care.  To afford each patient quality time with our provider, please arrive at least 15 minutes before your scheduled appointment time.   If you have a lab appointment with the Cancer Center please come in thru the  Main Entrance and check in at the main information desk  You need to re-schedule your appointment should you arrive 10 or more minutes late.  We strive to give you quality time with our providers, and arriving late affects you and other patients whose appointments are after yours.  Also, if you no show three or more times for appointments you may be dismissed from the clinic at the providers discretion.     Again, thank you for choosing [facility].  Our hope is that these requests will decrease the amount of time that you wait before being seen by our physicians.       _____________________________________________________________  Should you have questions after your visit to [facility], please contact our office at [phone] between the hours of 8:30 a.m. and 4:30 p.m.  Voicemails left after 4:30 p.m. will not be returned until the following business day.  For prescription refill requests, have your pharmacy contact our office.       Resources For Cancer Patients and their Caregivers ? American Cancer Society: Can assist with transportation, wigs, general needs, runs Look Good Feel Better.        1-888-227-6333 ? Cancer Care: Provides financial assistance, online support groups, medication/co-pay assistance.  1-800-813-HOPE (4673) ? [facility] Assists [place] cancer patients and their families through emotional , educational and financial support.  336-427-4357 ? [place] DSS Where to apply for food  stamps, Medicaid and utility assistance. 336-342-1394 ? [facility]: Transportation to medical appointments. 336-347-2287 ? Social Security Administration: May apply for disability if have a Stage IV cancer. 336-342-7796 1-800-772-1213 ? [place] Aging, Disability and Transit Services: Assists with nutrition, care and transit needs. 336-349-2343  Cancer Center Support Programs:   > Cancer Support Group  2nd Tuesday of the month 1pm-2pm, Journey Room   > Creative Journey  3rd Tuesday of the month 1130am-1pm, Journey Room     

## 2017-12-28 ENCOUNTER — Other Ambulatory Visit: Payer: Self-pay | Admitting: Acute Care

## 2017-12-28 DIAGNOSIS — F17210 Nicotine dependence, cigarettes, uncomplicated: Secondary | ICD-10-CM

## 2017-12-28 DIAGNOSIS — Z122 Encounter for screening for malignant neoplasm of respiratory organs: Secondary | ICD-10-CM

## 2018-01-08 ENCOUNTER — Ambulatory Visit (INDEPENDENT_AMBULATORY_CARE_PROVIDER_SITE_OTHER): Payer: Medicare Other | Admitting: Acute Care

## 2018-01-08 ENCOUNTER — Ambulatory Visit (INDEPENDENT_AMBULATORY_CARE_PROVIDER_SITE_OTHER)
Admission: RE | Admit: 2018-01-08 | Discharge: 2018-01-08 | Disposition: A | Discharge status: Home / Self Care | Payer: Medicare Other | Source: Ambulatory Visit | Attending: Acute Care | Admitting: Acute Care

## 2018-01-08 ENCOUNTER — Encounter: Payer: Self-pay | Admitting: Acute Care

## 2018-01-08 DIAGNOSIS — F17210 Nicotine dependence, cigarettes, uncomplicated: Secondary | ICD-10-CM | POA: Diagnosis not present

## 2018-01-08 DIAGNOSIS — Z122 Encounter for screening for malignant neoplasm of respiratory organs: Secondary | ICD-10-CM

## 2018-01-08 NOTE — Progress Notes (Signed)
Shared Decision Making Visit Lung Cancer Screening Program [phone])   Eligibility:  Age 67 y.o.  Pack Years Smoking History Calculation 42 pack year history (# packs/per year x # years smoked)  Recent History of coughing up blood  no  Unexplained weight loss? no ( >Than 15 pounds within the last 6 months )  Prior History Lung / other cancer no (Diagnosis within the last 5 years already requiring surveillance chest CT Scans).  Smoking Status Current Smoker  Former Smokers: Years since quit: NA  Quit Date: NA  Visit Components:  Discussion included one or more decision making aids. yes  Discussion included risk/benefits of screening. yes  Discussion included potential follow up diagnostic testing for abnormal scans. yes  Discussion included meaning and risk of over diagnosis. yes  Discussion included meaning and risk of False Positives. yes  Discussion included meaning of total radiation exposure. yes  Counseling Included:  Importance of adherence to annual lung cancer LDCT screening. yes  Impact of comorbidities on ability to participate in the program. yes  Ability and willingness to under diagnostic treatment. yes  Smoking Cessation Counseling:  Current Smokers:   Discussed importance of smoking cessation. yes  Information about tobacco cessation classes and interventions provided to patient. yes  Patient provided with "ticket" for LDCT Scan. yes  Symptomatic Patient. no  Counseling  Diagnosis Code: Tobacco Use Z72.0  Asymptomatic Patient yes  Counseling (Intermediate counseling: > three minutes counseling) [id number]  Former Smokers:   Discussed the importance of maintaining cigarette abstinence. yes  Diagnosis Code: Personal History of Nicotine Dependence. J50.093  Information about tobacco cessation classes and interventions provided to patient. Yes  Patient provided with "ticket" for LDCT Scan. yes  Written Order for Lung Cancer Screening with  LDCT placed in Epic. Yes (CT Chest Lung Cancer Screening Low Dose W/O CM) [id number] Z12.2-Screening of respiratory organs Z87.891-Personal history of nicotine dependence  I have spent 25 minutes of face to face time with Mr. Raver discussing the risks and benefits of lung cancer screening. We viewed a power point together that explained in detail the above noted topics. We paused at intervals to allow for questions to be asked and answered to ensure understanding.We discussed that the single most powerful action that he can take to decrease his risk of developing lung cancer is to quit smoking. We discussed whether or not he is ready to commit to setting a quit date. We discussed options for tools to aid in quitting smoking including nicotine replacement therapy, non-nicotine medications, support groups, Quit Smart classes, and behavior modification. We discussed that often times setting smaller, more achievable goals, such as eliminating 1 cigarette a day for a week and then 2 cigarettes a day for a week can be helpful in slowly decreasing the number of cigarettes smoked. This allows for a sense of accomplishment as well as providing a clinical benefit. I gave him the " Be Stronger Than Your Excuses" card with contact information for community resources, classes, free nicotine replacement therapy, and access to mobile apps, text messaging, and on-line smoking cessation help. I have also given him my card and contact information in the event he needs to contact me. We discussed the time and location of the scan, and that either Doroteo Glassman RN or I will call with the results within 24-48 hours of receiving them. I have offered him  a copy of the power point we viewed  as a resource in the event they need reinforcement of the  concepts we discussed today in the office. The patient verbalized understanding of all of  the above and had no further questions upon leaving the office. They have my contact information in  the event they have any further questions.  I spent 4 minutes counseling on smoking cessation and the health risks of continued tobacco abuse.  I explained to the patient that there has been a high incidence of coronary artery disease noted on these exams. I explained that this is a non-gated exam therefore degree or severity cannot be determined. This patient is on statin therapy. I have asked the patient to follow-up with their PCP regarding any incidental finding of coronary artery disease and management with diet or medication as their PCP  feels is clinically indicated. The patient verbalized understanding of the above and had no further questions upon completion of the visit.      Magdalen Spatz, NP 01/08/2018 3:24 PM

## 2018-01-19 ENCOUNTER — Other Ambulatory Visit: Payer: Self-pay | Admitting: Acute Care

## 2018-01-19 DIAGNOSIS — F17210 Nicotine dependence, cigarettes, uncomplicated: Principal | ICD-10-CM

## 2018-01-19 DIAGNOSIS — Z122 Encounter for screening for malignant neoplasm of respiratory organs: Secondary | ICD-10-CM

## 2018-09-02 IMAGING — CT CT CHEST LUNG CANCER SCREENING LOW DOSE W/O CM
3 of 5 series · 13 of 40 positions shown, 14 images · non-contrast
Comparison: 03/12/2007 chest radiograph.

CLINICAL DATA: 66-year-old asymptomatic male current smoker with 42
pack-year smoking history.

EXAM:
CT CHEST WITHOUT CONTRAST LOW-DOSE FOR LUNG CANCER SCREENING
TECHNIQUE: Multidetector CT imaging of the chest was performed following the
standard protocol without IV contrast.

[Series 2: thorax 5.0 i31f 3 · axial · 0.76mm/px · z∈[+968,+1078]mm · 2 of 66 slices shown, 3 images]
[im 22/66  mediastinal]
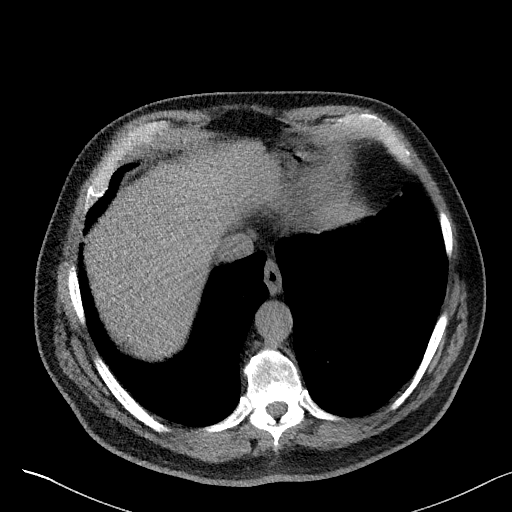
[im 22/66  lung]
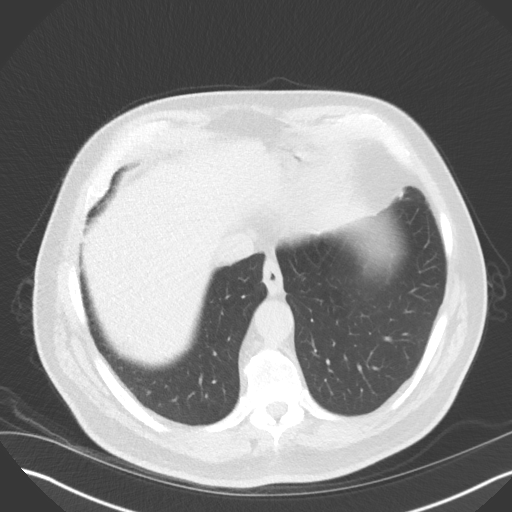
[im 44/66  lung]
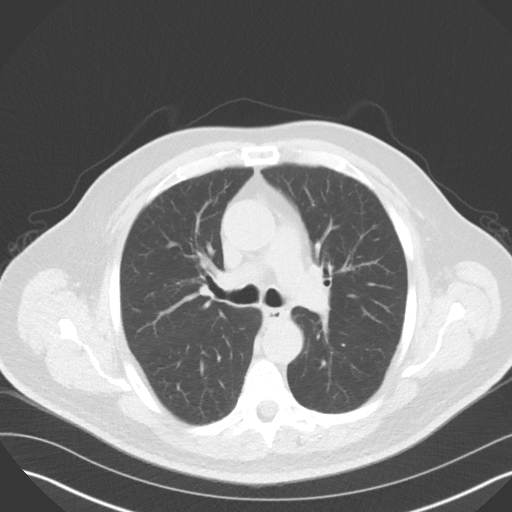

[Series 3: lung thins 1.0 · axial · 0.76mm/px · z∈[+927,+1161]mm · 8 of 288 slices shown]
[im 27/288  lung]
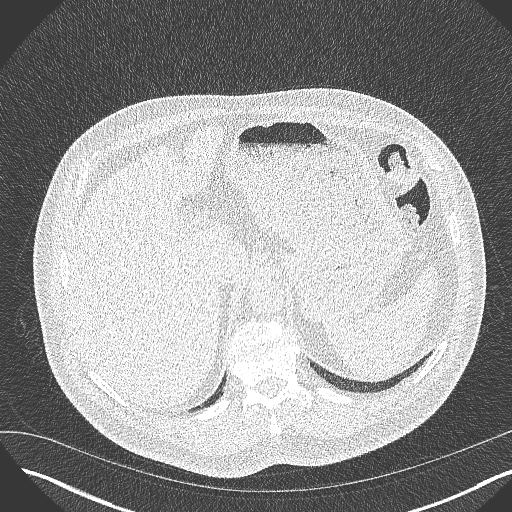
[im 53/288  lung]
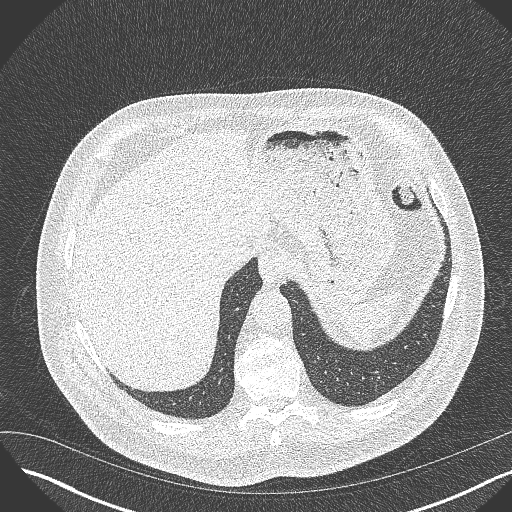
[im 92/288  lung]
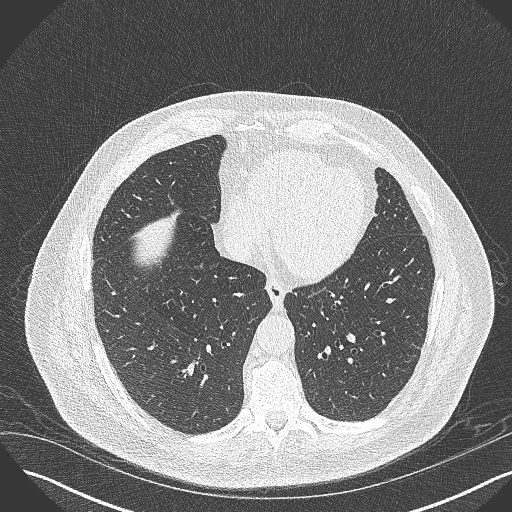
[im 131/288  lung]
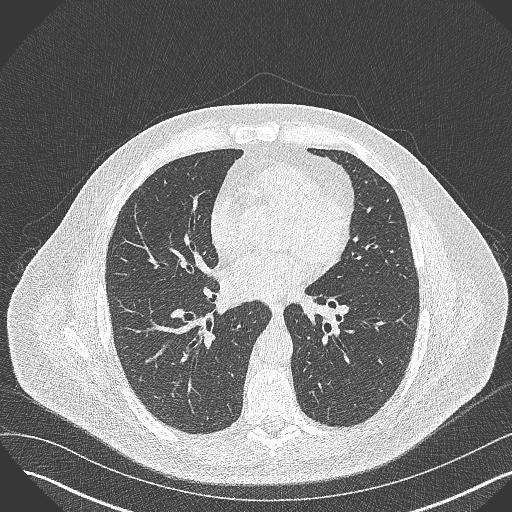
[im 157/288  lung]
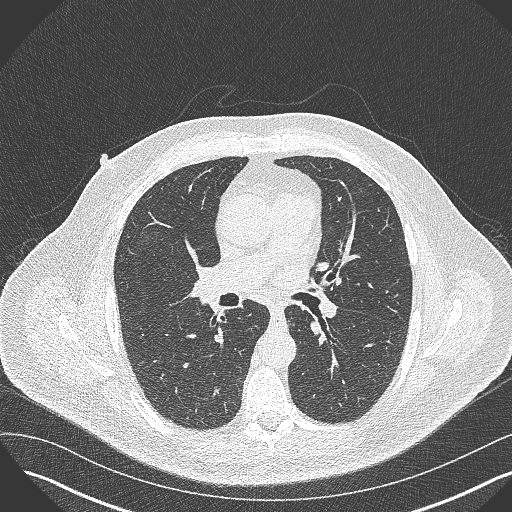
[im 196/288  lung]
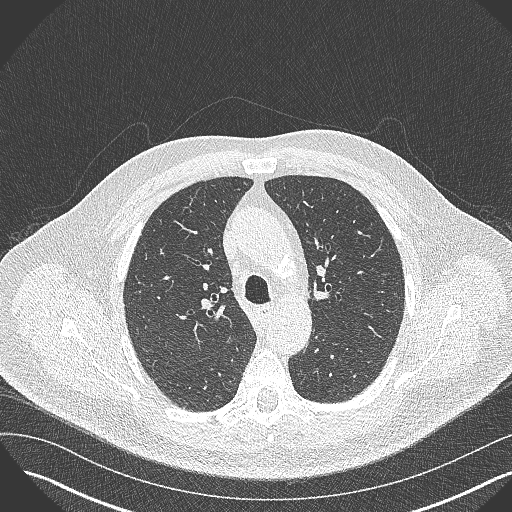
[im 235/288  lung]
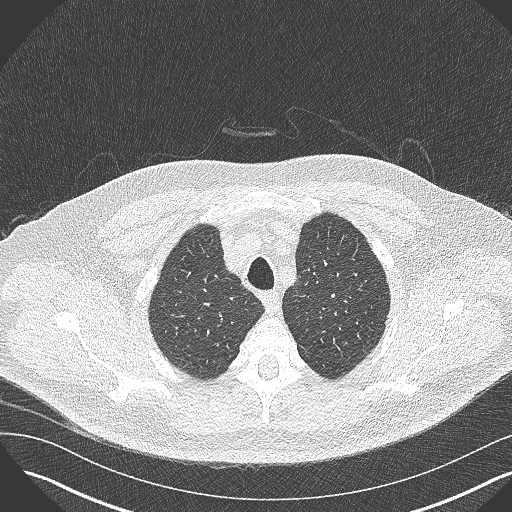
[im 261/288  lung]
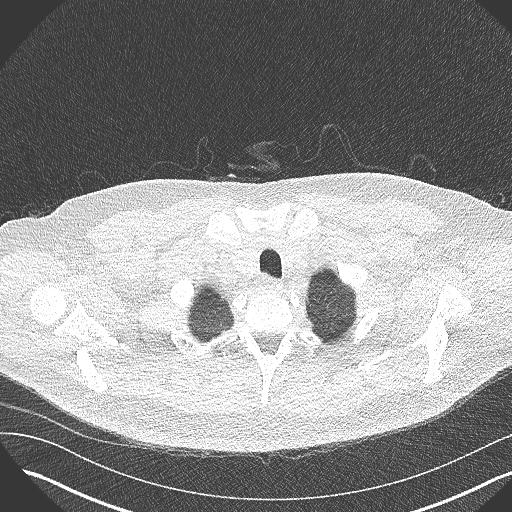

[Series 5: coronal · coronal · 0.66mm/px · 3 of 156 slices shown]
[im 32/156  lung]
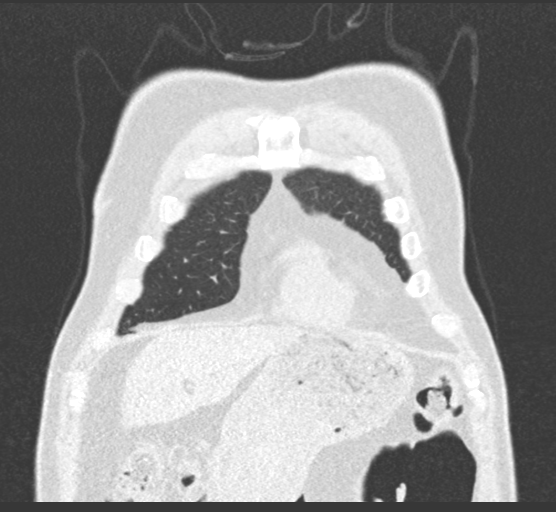
[im 63/156  lung]
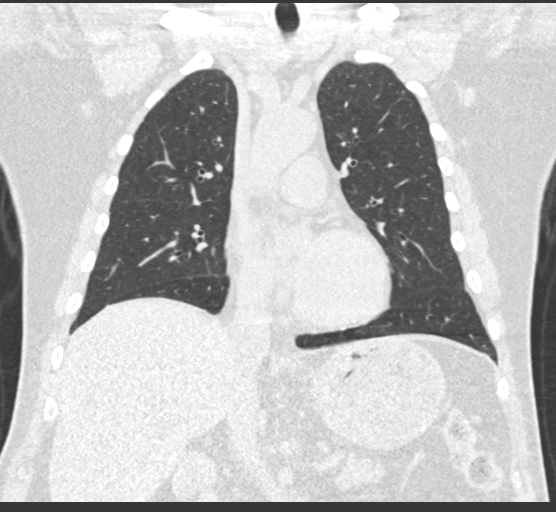
[im 94/156  lung]
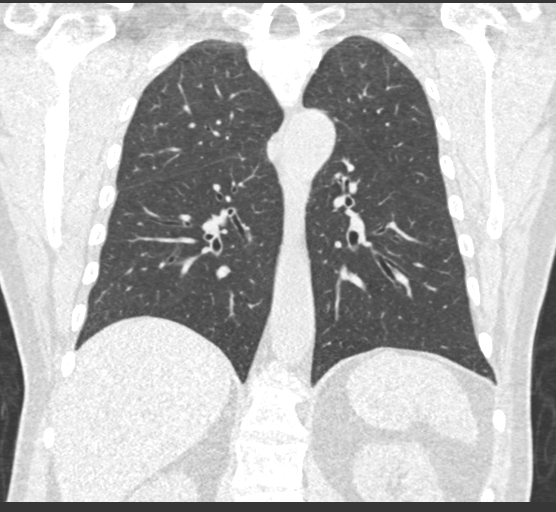

[13 of 40 positions shown; findings below may reference images not displayed]

FINDINGS: Cardiovascular: Normal heart size. No significant pericardial
effusion/thickening. Left anterior descending, left circumflex and
right coronary atherosclerosis. Atherosclerotic nonaneurysmal
thoracic aorta. Normal caliber pulmonary arteries.

Mediastinum/Nodes: No discrete thyroid nodules. Unremarkable
esophagus. No pathologically enlarged axillary, mediastinal or hilar
lymph nodes, noting limited sensitivity for the detection of hilar
adenopathy on this noncontrast study.

Lungs/Pleura: No pneumothorax. No pleural effusion. No acute
consolidative airspace disease or lung masses. A few scattered small
solid pulmonary nodules in both lungs, largest 3.4 mm in volume
derived mean diameter in the right lower lobe (series 3/image 181).

Upper abdomen: Cholecystectomy.

Musculoskeletal: No aggressive appearing focal osseous lesions.
Moderate thoracic spondylosis.
IMPRESSION: 1. Lung-RADS 2, benign appearance or behavior. Continue annual
screening with low-dose chest CT without contrast in 12 months.
2. Three-vessel coronary atherosclerosis.

Aortic Atherosclerosis ([id number]).

## 2018-11-26 ENCOUNTER — Other Ambulatory Visit (HOSPITAL_COMMUNITY): Payer: Self-pay | Admitting: *Deleted

## 2018-11-26 DIAGNOSIS — D473 Essential (hemorrhagic) thrombocythemia: Secondary | ICD-10-CM

## 2018-11-26 DIAGNOSIS — D72820 Lymphocytosis (symptomatic): Secondary | ICD-10-CM

## 2018-11-26 DIAGNOSIS — D75839 Thrombocytosis, unspecified: Secondary | ICD-10-CM

## 2018-11-29 ENCOUNTER — Other Ambulatory Visit: Payer: Self-pay

## 2018-11-29 ENCOUNTER — Inpatient Hospital Stay (HOSPITAL_COMMUNITY): Payer: Medicare Other | Attending: Hematology

## 2018-11-29 DIAGNOSIS — D72829 Elevated white blood cell count, unspecified: Secondary | ICD-10-CM | POA: Diagnosis present

## 2018-11-29 DIAGNOSIS — Z720 Tobacco use: Secondary | ICD-10-CM | POA: Diagnosis not present

## 2018-11-29 DIAGNOSIS — D75839 Thrombocytosis, unspecified: Secondary | ICD-10-CM

## 2018-11-29 DIAGNOSIS — D473 Essential (hemorrhagic) thrombocythemia: Secondary | ICD-10-CM

## 2018-11-29 DIAGNOSIS — D72820 Lymphocytosis (symptomatic): Secondary | ICD-10-CM

## 2018-11-29 LAB — COMPREHENSIVE METABOLIC PANEL
ALT: 30 U/L (ref 0–44)
AST: 20 U/L (ref 15–41)
Albumin: 4.1 g/dL (ref 3.5–5.0)
Alkaline Phosphatase: 63 U/L (ref 38–126)
Anion gap: 8 (ref 5–15)
BUN: 27 mg/dL — ABNORMAL HIGH (ref 8–23)
CO2: 26 mmol/L (ref 22–32)
Calcium: 9.8 mg/dL (ref 8.9–10.3)
Chloride: 106 mmol/L (ref 98–111)
Creatinine, Ser: 0.91 mg/dL (ref 0.61–1.24)
GFR calc Af Amer: >60 mL/min (ref >60)
GFR calc non Af Amer: >60 mL/min (ref >60)
Glucose, Bld: 57 mg/dL — ABNORMAL LOW (ref 70–99)
Potassium: 3.8 mmol/L (ref 3.5–5.1)
Sodium: 140 mmol/L (ref 135–145)
Total Bilirubin: 0.2 mg/dL — ABNORMAL LOW (ref 0.3–1.2)
Total Protein: 7.2 g/dL (ref 6.5–8.1)

## 2018-11-29 LAB — CBC WITH DIFFERENTIAL/PLATELET
Abs Immature Granulocytes: 0.12 10*3/uL — ABNORMAL HIGH (ref 0.00–0.07)
Basophils Absolute: 0.1 10*3/uL (ref 0.0–0.1)
Basophils Relative: 1 %
Eosinophils Absolute: 0.7 10*3/uL — ABNORMAL HIGH (ref 0.0–0.5)
Eosinophils Relative: 5 %
HCT: 43.4 % (ref 39.0–52.0)
Hemoglobin: 14.2 g/dL (ref 13.0–17.0)
Immature Granulocytes: 1 %
Lymphocytes Relative: 42 %
Lymphs Abs: 5.7 10*3/uL — ABNORMAL HIGH (ref 0.7–4.0)
MCH: 30.5 pg (ref 26.0–34.0)
MCHC: 32.7 g/dL (ref 30.0–36.0)
MCV: 93.1 fL (ref 80.0–100.0)
Monocytes Absolute: 1.6 10*3/uL — ABNORMAL HIGH (ref 0.1–1.0)
Monocytes Relative: 12 %
Neutro Abs: 5.2 10*3/uL (ref 1.7–7.7)
Neutrophils Relative %: 39 %
Platelets: 416 10*3/uL — ABNORMAL HIGH (ref 150–400)
RBC: 4.66 MIL/uL (ref 4.22–5.81)
RDW: 13.8 % (ref 11.5–15.5)
WBC: 13.5 10*3/uL — ABNORMAL HIGH (ref 4.0–10.5)
nRBC: 0 % (ref 0.0–0.2)

## 2018-11-29 LAB — LACTATE DEHYDROGENASE: LDH: 122 U/L (ref 98–192)

## 2018-12-06 ENCOUNTER — Other Ambulatory Visit: Payer: Self-pay

## 2018-12-06 ENCOUNTER — Inpatient Hospital Stay (HOSPITAL_COMMUNITY): Payer: Medicare Other | Admitting: Hematology

## 2018-12-06 ENCOUNTER — Encounter (HOSPITAL_COMMUNITY): Payer: Self-pay | Admitting: Hematology

## 2018-12-06 VITALS — BP 151/82 | HR 87 | Temp 97.5°F | Resp 18 | Wt 191.0 lb

## 2018-12-06 DIAGNOSIS — F17200 Nicotine dependence, unspecified, uncomplicated: Secondary | ICD-10-CM

## 2018-12-06 DIAGNOSIS — D72820 Lymphocytosis (symptomatic): Secondary | ICD-10-CM | POA: Diagnosis not present

## 2018-12-06 DIAGNOSIS — F17210 Nicotine dependence, cigarettes, uncomplicated: Secondary | ICD-10-CM

## 2018-12-06 DIAGNOSIS — D72829 Elevated white blood cell count, unspecified: Secondary | ICD-10-CM | POA: Insufficient documentation

## 2018-12-06 DIAGNOSIS — IMO0001 Reserved for inherently not codable concepts without codable children: Secondary | ICD-10-CM

## 2018-12-06 DIAGNOSIS — Z720 Tobacco use: Secondary | ICD-10-CM | POA: Diagnosis not present

## 2018-12-06 NOTE — Progress Notes (Signed)
[place], [place] [address]   CLINIC:  Medical Oncology/Hematology  PCP:  Renee Rival, NP [address] [place] [address] [phone]   REASON FOR VISIT:  Follow-up for leukocytosis.   INTERVAL HISTORY:  Logan Carey 68 y.o. male seen for follow-up of leukocytosis.  Had leukocytosis dating back to 2017.  He also had a bone marrow biopsy done in June 2017 for further work-up.  Denies any nausea, vomiting, diarrhea or constipation.  Denies any bleeding per rectum or melena.  No change in bowel habits.  Denies any ER visits or hospitalizations.  Continuing to smoke 1 pack of cigarettes per week.  Denies any fevers, night sweats or weight loss.  No change in baseline cough or hemoptysis reported.    REVIEW OF SYSTEMS:  Review of Systems  All other systems reviewed and are negative.    PAST MEDICAL/SURGICAL HISTORY:  Past Medical History:  Diagnosis Date  . Diabetes mellitus without complication ([place])   . High cholesterol   . Hypertension   . Leukocytosis 10/01/2015  . Lymphocytosis 10/01/2015  . Thrombocytosis ([place]) 10/01/2015   Past Surgical History:  Procedure Laterality Date  . CHOLECYSTECTOMY       SOCIAL HISTORY:  Social History   Socioeconomic History  . Marital status: Married    Spouse name: Not on file  . Number of children: Not on file  . Years of education: Not on file  . Highest education level: Not on file  Occupational History  . Not on file  Social Needs  . Financial resource strain: Not on file  . Food insecurity    Worry: Not on file    Inability: Not on file  . Transportation needs    Medical: Not on file    Non-medical: Not on file  Tobacco Use  . Smoking status: Current Every Day Smoker    Packs/day: 0.75    Years: 56.00    Pack years: 42.00    Types: Cigarettes  . Smokeless tobacco: Never Used  Substance and Sexual Activity  . Alcohol use: No    Comment: Use to drink 1/2 of a 5th of [employer] every weekend quitting in ~ 1977  . Drug use: No  . Sexual activity: Not on file  Lifestyle  . Physical activity    Days per week: Not on file    Minutes per session: Not on file  . Stress: Not on file  Relationships  . Social Herbalist on phone: Not on file    Gets together: Not on file    Attends religious service: Not on file    Active member of club or organization: Not on file    Attends meetings of clubs or organizations: Not on file    Relationship status: Not on file  . Intimate partner violence    Fear of current or ex partner: Not on file    Emotionally abused: Not on file    Physically abused: Not on file    Forced sexual activity: Not on file  Other Topics Concern  . Not on file  Social History Narrative  . Not on file    FAMILY HISTORY:  Family History  Problem Relation Age of Onset  . Stroke Father   . Cancer Father 60       Lung cancer-smoker  . Hypertension Sister   . Hypertension Brother   . Hyperlipidemia Son  CURRENT MEDICATIONS:  Outpatient Encounter Medications as of 12/06/2018  Medication Sig Note  . aspirin 81 MG tablet Take 81 mg by mouth daily.   . benazepril (LOTENSIN) 20 MG tablet Take 20 mg by mouth 2 (two) times daily. 10/01/2015: Received from: External Pharmacy Received Sig:   . buPROPion (WELLBUTRIN XL) 300 MG 24 hr tablet    . diazepam (VALIUM) 5 MG tablet Take 5 mg by mouth 3 (three) times daily. 10/01/2015: Received from: External Pharmacy Received Sig:   . fenofibrate 160 MG tablet Take 160 mg by mouth daily. 10/01/2015: Received from: External Pharmacy Received Sig:   . gabapentin (NEURONTIN) 400 MG capsule    . HUMALOG KWIKPEN 100 UNIT/ML KiwkPen Inject 35 Units into the skin 3 (three) times daily. 10/01/2015: Received from: External Pharmacy Received Sig:   . hydrochlorothiazide (HYDRODIURIL) 25 MG tablet    . insulin detemir (LEVEMIR) 100 UNIT/ML injection Inject 70 Units into the skin at bedtime.   . metFORMIN  (GLUCOPHAGE-XR) 500 MG 24 hr tablet Take 500 mg by mouth. 4 tabs daily with supper 10/01/2015: Received from: External Pharmacy Received Sig:   . NOVOTWIST 32G X 5 MM MISC  10/01/2015: Received from: External Pharmacy  . ONE TOUCH ULTRA TEST test strip  10/01/2015: Received from: External Pharmacy  . oxyCODONE-acetaminophen (PERCOCET) 10-325 MG tablet Take 10-325 tablets by mouth every 8 (eight) hours as needed. 10/01/2015: Received from: External Pharmacy Received Sig:   . pravastatin (PRAVACHOL) 20 MG tablet Take 20 mg by mouth at bedtime. 10/01/2015: Received from: External Pharmacy Received Sig:   . tiZANidine (ZANAFLEX) 4 MG tablet    . topiramate (TOPAMAX) 50 MG tablet Take 60 mg by mouth 2 (two) times daily. Patient reports topamax has recently changed to 60 mg twice daily    No facility-administered encounter medications on file as of 12/06/2018.     ALLERGIES:  No Known Allergies   PHYSICAL EXAM:  ECOG Performance status: 1  Vitals:   12/06/18 1114  BP: (!) 151/82  Pulse: 87  Resp: 18  Temp: (!) 97.5 F (36.4 C)  SpO2: 94%   Filed Weights   12/06/18 1114  Weight: 191 lb (86.6 kg)    Physical Exam Vitals signs reviewed.  Constitutional:      Appearance: Normal appearance.  Cardiovascular:     Rate and Rhythm: Normal rate and regular rhythm.     Heart sounds: Normal heart sounds.  Pulmonary:     Effort: Pulmonary effort is normal.     Breath sounds: Normal breath sounds.  Abdominal:     General: There is no distension.     Palpations: Abdomen is soft. There is no mass.  Musculoskeletal:        General: No swelling.  Skin:    General: Skin is warm.  Neurological:     General: No focal deficit present.     Mental Status: He is alert and oriented to person, place, and time.  Psychiatric:        Mood and Affect: Mood normal.        Behavior: Behavior normal.      LABORATORY DATA:  I have reviewed the labs as listed.  CBC    Component Value Date/Time   WBC  13.5 (H) 11/29/2018 1111   RBC 4.66 11/29/2018 1111   HGB 14.2 11/29/2018 1111   HCT 43.4 11/29/2018 1111   PLT 416 (H) 11/29/2018 1111   MCV 93.1 11/29/2018 1111   MCH 30.5 11/29/2018 1111  MCHC 32.7 11/29/2018 1111   RDW 13.8 11/29/2018 1111   LYMPHSABS 5.7 (H) 11/29/2018 1111   MONOABS 1.6 (H) 11/29/2018 1111   EOSABS 0.7 (H) 11/29/2018 1111   BASOSABS 0.1 11/29/2018 1111   CMP Latest Ref Rng & Units 11/29/2018 01/14/2016 10/01/2015  Glucose 70 - 99 mg/dL 57(L) 204(H) 156(H)  BUN 8 - 23 mg/dL 27(H) 15 19  Creatinine 0.61 - 1.24 mg/dL 0.91 0.89 0.91  Sodium 135 - 145 mmol/L 140 136 135  Potassium 3.5 - 5.1 mmol/L 3.8 3.9 4.5  Chloride 98 - 111 mmol/L 106 105 102  CO2 22 - 32 mmol/L _0 Calcium 8.9 - 10.3 mg/dL 9.8 9.4 9.8  Total Protein 6.5 - 8.1 g/dL 7.2 6.9 7.6  Total Bilirubin 0.3 - 1.2 mg/dL 0.2(L) 0.3 0.6  Alkaline Phos 38 - 126 U/L 63 67 67  AST 15 - 41 U/L _1 ALT 0 - 44 U/L _2 DIAGNOSTIC IMAGING:  I have independently reviewed the scans and discussed with the patient.    ASSESSMENT & PLAN:   Leukocytosis 1.  Leukocytosis: -Patient had mildly elevated white count since 2017.  This was predominantly lymphocytes and monocytes and occasional eosinophils. - Jak 2 V6 44F and BCR/ABL testing on 10/01/2015 was negative. -Bone marrow biopsy on 11/06/2015 shows hypercellular bone marrow with trilineage hematopoiesis with mild polyclonal plasmacytosis.  Abundant megakaryocytes some of which display abnormal morphology with large and hyperchromatic cells.  No increased blasts.  Normal chromosome analysis. - We reviewed his blood work.  White count is 13.5 and platelets are 416.  Absolute lymphocyte count and monocyte count was elevated.  No significant changes noted. -He denies any B symptoms.  Elevated white count likely related to history of smoking although smoking-related leukocytosis is predominantly neutrophils. -We will see him back in 1 year  for follow-up with repeat blood counts.  2.  Smoking history: - CT chest low-dose lung cancer screening on 01/11/2018 was lung RADS 2. -We will schedule him for another scan end of this August.  We will set up a phone appointment for follow-up of scan results.   Total time spent is 25 minutes with more than 50% of the time spent face-to-face discussing lab results, surveillance plan, counseling and coordination of care.  Orders placed this encounter:  Orders Placed This Encounter  Procedures  . CT CHEST LUNG CA SCREEN LOW DOSE W/O CM      Derek Jack, MD [place] [phone]

## 2018-12-06 NOTE — Patient Instructions (Signed)
[facility] at [facility] Discharge Instructions  You were seen today by Dr. Katragadda. He went over your recent lab results. He will see you back in 1 year for labs and follow up.   Thank you for choosing [facility] at [facility] to provide your oncology and hematology care.  To afford each patient quality time with our provider, please arrive at least 15 minutes before your scheduled appointment time.   If you have a lab appointment with the Cancer Center please come in thru the  Main Entrance and check in at the main information desk  You need to re-schedule your appointment should you arrive 10 or more minutes late.  We strive to give you quality time with our providers, and arriving late affects you and other patients whose appointments are after yours.  Also, if you no show three or more times for appointments you may be dismissed from the clinic at the providers discretion.     Again, thank you for choosing [facility].  Our hope is that these requests will decrease the amount of time that you wait before being seen by our physicians.       _____________________________________________________________  Should you have questions after your visit to [facility], please contact our office at [phone] between the hours of 8:00 a.m. and 4:30 p.m.  Voicemails left after 4:00 p.m. will not be returned until the following business day.  For prescription refill requests, have your pharmacy contact our office and allow 72 hours.    Cancer Center Support Programs:   > Cancer Support Group  2nd Tuesday of the month 1pm-2pm, Journey Room    

## 2018-12-06 NOTE — Assessment & Plan Note (Addendum)
1.  Leukocytosis: -Patient had mildly elevated white count since 2017.  This was predominantly lymphocytes and monocytes and occasional eosinophils. - Jak 2 V6 35F and BCR/ABL testing on 10/01/2015 was negative. -Bone marrow biopsy on 11/06/2015 shows hypercellular bone marrow with trilineage hematopoiesis with mild polyclonal plasmacytosis.  Abundant megakaryocytes some of which display abnormal morphology with large and hyperchromatic cells.  No increased blasts.  Normal chromosome analysis. - We reviewed his blood work.  White count is 13.5 and platelets are 416.  Absolute lymphocyte count and monocyte count was elevated.  No significant changes noted. -He denies any B symptoms.  Elevated white count likely related to history of smoking although smoking-related leukocytosis is predominantly neutrophils. -We will see him back in 1 year for follow-up with repeat blood counts.  2.  Smoking history: - CT chest low-dose lung cancer screening on 01/11/2018 was lung RADS 2. -We will schedule him for another scan end of this August.  We will set up a phone appointment for follow-up of scan results.

## 2018-12-22 ENCOUNTER — Ambulatory Visit (HOSPITAL_COMMUNITY): Payer: Medicare Other

## 2018-12-24 ENCOUNTER — Ambulatory Visit (HOSPITAL_COMMUNITY): Payer: Medicare Other | Admitting: Nurse Practitioner

## 2019-01-10 ENCOUNTER — Ambulatory Visit (HOSPITAL_COMMUNITY): Payer: Medicare Other

## 2019-01-17 ENCOUNTER — Ambulatory Visit (HOSPITAL_COMMUNITY): Payer: Medicare Other

## 2019-01-19 ENCOUNTER — Other Ambulatory Visit: Payer: Self-pay

## 2019-01-19 ENCOUNTER — Inpatient Hospital Stay (HOSPITAL_BASED_OUTPATIENT_CLINIC_OR_DEPARTMENT_OTHER): Payer: Medicare Other | Admitting: Nurse Practitioner

## 2019-01-19 DIAGNOSIS — D72820 Lymphocytosis (symptomatic): Secondary | ICD-10-CM

## 2019-01-19 DIAGNOSIS — D72829 Elevated white blood cell count, unspecified: Secondary | ICD-10-CM | POA: Diagnosis present

## 2019-01-19 NOTE — Assessment & Plan Note (Signed)
1.  Leukocytosis: - Patient had mildly elevated white count since 2017.  This was predominantly lymphocytes and monocytes and occasional eosinophils. -Jak 2 V6 58F and BCR-ABL testing on 10/01/2015 was negative. -Bone marrow biopsy on 11/06/2015 showed hypercellular bone marrow with trilineage hematopoiesis with mild polyclonal plasmacytosis.  Abundant megakaryocytes some of which displayed abnormal morphology with large and hypochromic cells.  No increased blast.  Normal chromosome analysis. - He denies any B symptoms.  Elevated white count likely related to history of smoking although smoking-related leukocytosis is predominantly neutrophils. -Labs on -We will see him back in 1 year with repeat blood counts.  2.  Smoking history: -CT chest low-dose lung cancer screening on 01/11/2018 was a lung RADS 2. - We will reschedule another scan in a year.

## 2019-02-02 ENCOUNTER — Ambulatory Visit (HOSPITAL_COMMUNITY)
Admission: RE | Admit: 2019-02-02 | Discharge: 2019-02-02 | Disposition: A | Discharge status: Home / Self Care | Payer: Medicare Other | Source: Ambulatory Visit | Attending: Acute Care | Admitting: Acute Care

## 2019-02-02 ENCOUNTER — Other Ambulatory Visit: Payer: Self-pay

## 2019-02-02 DIAGNOSIS — F17210 Nicotine dependence, cigarettes, uncomplicated: Secondary | ICD-10-CM | POA: Insufficient documentation

## 2019-02-02 DIAGNOSIS — Z122 Encounter for screening for malignant neoplasm of respiratory organs: Secondary | ICD-10-CM | POA: Diagnosis present

## 2019-02-08 ENCOUNTER — Inpatient Hospital Stay (HOSPITAL_COMMUNITY): Payer: Medicare Other

## 2019-02-08 ENCOUNTER — Other Ambulatory Visit: Payer: Self-pay | Admitting: *Deleted

## 2019-02-08 ENCOUNTER — Inpatient Hospital Stay (HOSPITAL_COMMUNITY): Payer: Medicare Other | Attending: Hematology | Admitting: Nurse Practitioner

## 2019-02-08 ENCOUNTER — Other Ambulatory Visit: Payer: Self-pay

## 2019-02-08 DIAGNOSIS — D473 Essential (hemorrhagic) thrombocythemia: Secondary | ICD-10-CM

## 2019-02-08 DIAGNOSIS — Z122 Encounter for screening for malignant neoplasm of respiratory organs: Secondary | ICD-10-CM

## 2019-02-08 DIAGNOSIS — D72820 Lymphocytosis (symptomatic): Secondary | ICD-10-CM

## 2019-02-08 DIAGNOSIS — F17210 Nicotine dependence, cigarettes, uncomplicated: Secondary | ICD-10-CM

## 2019-02-08 DIAGNOSIS — R911 Solitary pulmonary nodule: Secondary | ICD-10-CM | POA: Diagnosis not present

## 2019-02-08 DIAGNOSIS — D72829 Elevated white blood cell count, unspecified: Secondary | ICD-10-CM | POA: Diagnosis not present

## 2019-02-08 DIAGNOSIS — Z87891 Personal history of nicotine dependence: Secondary | ICD-10-CM

## 2019-02-08 DIAGNOSIS — D75839 Thrombocytosis, unspecified: Secondary | ICD-10-CM

## 2019-02-08 LAB — COMPREHENSIVE METABOLIC PANEL
ALT: 23 U/L (ref 0–44)
AST: 20 U/L (ref 15–41)
Albumin: 4 g/dL (ref 3.5–5.0)
Alkaline Phosphatase: 76 U/L (ref 38–126)
Anion gap: 9 (ref 5–15)
BUN: 23 mg/dL (ref 8–23)
CO2: 24 mmol/L (ref 22–32)
Calcium: 9.7 mg/dL (ref 8.9–10.3)
Chloride: 106 mmol/L (ref 98–111)
Creatinine, Ser: 1.02 mg/dL (ref 0.61–1.24)
GFR calc Af Amer: >60 mL/min (ref >60)
GFR calc non Af Amer: >60 mL/min (ref >60)
Glucose, Bld: 100 mg/dL — ABNORMAL HIGH (ref 70–99)
Potassium: 3.6 mmol/L (ref 3.5–5.1)
Sodium: 139 mmol/L (ref 135–145)
Total Bilirubin: 0.6 mg/dL (ref 0.3–1.2)
Total Protein: 7.7 g/dL (ref 6.5–8.1)

## 2019-02-08 LAB — CBC WITH DIFFERENTIAL/PLATELET
Abs Immature Granulocytes: 0.04 10*3/uL (ref 0.00–0.07)
Basophils Absolute: 0.1 10*3/uL (ref 0.0–0.1)
Basophils Relative: 1 %
Eosinophils Absolute: 0.7 10*3/uL — ABNORMAL HIGH (ref 0.0–0.5)
Eosinophils Relative: 7 %
HCT: 48 % (ref 39.0–52.0)
Hemoglobin: 15.6 g/dL (ref 13.0–17.0)
Immature Granulocytes: 0 %
Lymphocytes Relative: 37 %
Lymphs Abs: 3.9 10*3/uL (ref 0.7–4.0)
MCH: 30.3 pg (ref 26.0–34.0)
MCHC: 32.5 g/dL (ref 30.0–36.0)
MCV: 93.2 fL (ref 80.0–100.0)
Monocytes Absolute: 1.1 10*3/uL — ABNORMAL HIGH (ref 0.1–1.0)
Monocytes Relative: 10 %
Neutro Abs: 4.6 10*3/uL (ref 1.7–7.7)
Neutrophils Relative %: 45 %
Platelets: 473 10*3/uL — ABNORMAL HIGH (ref 150–400)
RBC: 5.15 MIL/uL (ref 4.22–5.81)
RDW: 13.2 % (ref 11.5–15.5)
WBC: 10.4 10*3/uL (ref 4.0–10.5)
nRBC: 0 % (ref 0.0–0.2)

## 2019-02-08 LAB — LACTATE DEHYDROGENASE: LDH: 114 U/L (ref 98–192)

## 2019-02-08 NOTE — Progress Notes (Signed)
[address], [place] [address]   CLINIC:  Medical Oncology/Hematology  PCP:  Renee Rival, NP [address] [place] [address] [phone]   REASON FOR VISIT: Follow-up for leukocytosis and thrombocytosis  CURRENT THERAPY: observation   INTERVAL HISTORY:  Mr. Nouri 68 y.o. male is called for routine follow-up for leukocytosis.  Patient reports he is doing well.  He denies any bright red bleeding per rectum or melena.  He reports he is doing well with his energy levels.  He denies any recent infections. Denies any nausea, vomiting, or diarrhea. Denies any new pains. Had not noticed any recent bleeding such as epistaxis, hematuria or hematochezia. Denies recent chest pain on exertion, shortness of breath on minimal exertion, pre-syncopal episodes, or palpitations. Denies any numbness or tingling in hands or feet. Denies any recent fevers, infections, or recent hospitalizations. Patient reports appetite at 100% and energy level at 100%.  He is eating well maintain his weight this time.    REVIEW OF SYSTEMS:  Review of Systems  All other systems reviewed and are negative.    PAST MEDICAL/SURGICAL HISTORY:  Past Medical History:  Diagnosis Date  . Diabetes mellitus without complication ([place])   . High cholesterol   . Hypertension   . Leukocytosis 10/01/2015  . Lymphocytosis 10/01/2015  . Thrombocytosis ([place]) 10/01/2015   Past Surgical History:  Procedure Laterality Date  . CHOLECYSTECTOMY       SOCIAL HISTORY:  Social History   Socioeconomic History  . Marital status: Married    Spouse name: Not on file  . Number of children: Not on file  . Years of education: Not on file  . Highest education level: Not on file  Occupational History  . Not on file  Social Needs  . Financial resource strain: Not on file  . Food insecurity    Worry: Not on file    Inability: Not on file  . Transportation needs    Medical: Not on file   Non-medical: Not on file  Tobacco Use  . Smoking status: Current Every Day Smoker    Packs/day: 0.75    Years: 56.00    Pack years: 42.00    Types: Cigarettes  . Smokeless tobacco: Never Used  Substance and Sexual Activity  . Alcohol use: No    Comment: Use to drink 1/2 of a 5th of [employer] every weekend quitting in ~ 1977  . Drug use: No  . Sexual activity: Not on file  Lifestyle  . Physical activity    Days per week: Not on file    Minutes per session: Not on file  . Stress: Not on file  Relationships  . Social Herbalist on phone: Not on file    Gets together: Not on file    Attends religious service: Not on file    Active member of club or organization: Not on file    Attends meetings of clubs or organizations: Not on file    Relationship status: Not on file  . Intimate partner violence    Fear of current or ex partner: Not on file    Emotionally abused: Not on file    Physically abused: Not on file    Forced sexual activity: Not on file  Other Topics Concern  . Not on file  Social History Narrative  . Not on file    FAMILY HISTORY:  Family History  Problem Relation Age of Onset  .  Stroke Father   . Cancer Father 15       Lung cancer-smoker  . Hypertension Sister   . Hypertension Brother   . Hyperlipidemia Son     CURRENT MEDICATIONS:  Outpatient Encounter Medications as of 02/08/2019  Medication Sig Note  . aspirin 81 MG tablet Take 81 mg by mouth daily.   . benazepril (LOTENSIN) 20 MG tablet Take 20 mg by mouth 2 (two) times daily. 10/01/2015: Received from: External Pharmacy Received Sig:   . buPROPion (WELLBUTRIN XL) 300 MG 24 hr tablet    . diazepam (VALIUM) 5 MG tablet Take 5 mg by mouth 3 (three) times daily. 10/01/2015: Received from: External Pharmacy Received Sig:   . fenofibrate 160 MG tablet Take 160 mg by mouth daily. 10/01/2015: Received from: External Pharmacy Received Sig:   . gabapentin (NEURONTIN) 400 MG capsule    . HUMALOG KWIKPEN  100 UNIT/ML KiwkPen Inject 35 Units into the skin 3 (three) times daily. 10/01/2015: Received from: External Pharmacy Received Sig:   . hydrochlorothiazide (HYDRODIURIL) 25 MG tablet    . insulin detemir (LEVEMIR) 100 UNIT/ML injection Inject 70 Units into the skin at bedtime.   . metFORMIN (GLUCOPHAGE-XR) 500 MG 24 hr tablet Take 500 mg by mouth. 4 tabs daily with supper 10/01/2015: Received from: External Pharmacy Received Sig:   . NOVOTWIST 32G X 5 MM MISC  10/01/2015: Received from: External Pharmacy  . ONE TOUCH ULTRA TEST test strip  10/01/2015: Received from: External Pharmacy  . oxyCODONE-acetaminophen (PERCOCET) 10-325 MG tablet Take 10-325 tablets by mouth every 8 (eight) hours as needed. 10/01/2015: Received from: External Pharmacy Received Sig:   . pravastatin (PRAVACHOL) 20 MG tablet Take 20 mg by mouth at bedtime. 10/01/2015: Received from: External Pharmacy Received Sig:   . tiZANidine (ZANAFLEX) 4 MG tablet    . topiramate (TOPAMAX) 50 MG tablet Take 60 mg by mouth 2 (two) times daily. Patient reports topamax has recently changed to 60 mg twice daily    No facility-administered encounter medications on file as of 02/08/2019.     ALLERGIES:  No Known Allergies   PHYSICAL EXAM:  ECOG Performance status: 1  Vital signs: -Deferred due to telephone visit   Physical Exam -Deferred due to telephone visit - Patient was alert and oriented and in no acute distress  LABORATORY DATA:  I have reviewed the labs as listed.  CBC    Component Value Date/Time   WBC 10.4 02/08/2019 1346   RBC 5.15 02/08/2019 1346   HGB 15.6 02/08/2019 1346   HCT 48.0 02/08/2019 1346   PLT 473 (H) 02/08/2019 1346   MCV 93.2 02/08/2019 1346   MCH 30.3 02/08/2019 1346   MCHC 32.5 02/08/2019 1346   RDW 13.2 02/08/2019 1346   LYMPHSABS 3.9 02/08/2019 1346   MONOABS 1.1 (H) 02/08/2019 1346   EOSABS 0.7 (H) 02/08/2019 1346   BASOSABS 0.1 02/08/2019 1346   CMP Latest Ref Rng & Units 11/29/2018 01/14/2016  10/01/2015  Glucose 70 - 99 mg/dL 57(L) 204(H) 156(H)  BUN 8 - 23 mg/dL 27(H) 15 19  Creatinine 0.61 - 1.24 mg/dL 0.91 0.89 0.91  Sodium 135 - 145 mmol/L 140 136 135  Potassium 3.5 - 5.1 mmol/L 3.8 3.9 4.5  Chloride 98 - 111 mmol/L 106 105 102  CO2 22 - 32 mmol/L _0 Calcium 8.9 - 10.3 mg/dL 9.8 9.4 9.8  Total Protein 6.5 - 8.1 g/dL 7.2 6.9 7.6  Total Bilirubin 0.3 - 1.2 mg/dL 0.2(L)  0.3 0.6  Alkaline Phos 38 - 126 U/L 63 67 67  AST 15 - 41 U/L _0 ALT 0 - 44 U/L _1 I personally performed a face-to-face visit.  All questions were answered to patient's stated satisfaction. Encouraged patient to call with any new concerns or questions before his next visit to the cancer center and we can certain see him sooner, if needed.     ASSESSMENT & PLAN:   Leukocytosis 1.  Leukocytosis: - Patient had mildly elevated white count since 2017.  This was predominantly lymphocytes and monocytes and occasional eosinophils. -Jak 2 V6 1F and BCR-ABL testing on 10/01/2015 was negative. -Bone marrow biopsy on 11/06/2015 showed hypercellular bone marrow with trilineage hematopoiesis with mild polyclonal plasmacytosis.  Abundant megakaryocytes some of which displayed abnormal morphology with large and hypochromic cells.  No increased blast.  Normal chromosome analysis. - He denies any B symptoms.  Elevated white count likely related to history of smoking although smoking-related leukocytosis is predominantly neutrophils. -Labs on 02/08/2019 showed his white count 10.4 -We will see him back in 6 months with labs.  2.  Thrombocytosis: - Patient has had increased platelets off and on since 2017. -Labs on 02/08/2019 showed his platelets 473. -We will recheck them in 6 months.  2.  Smoking history: -CT chest low-dose lung cancer screening on 01/11/2018 was a lung RADS 2. -CT low-dose lung screening done on 02/02/2019 was long RADS category 3S.  He will need a follow-up nodule screening in 6  months.  This was ordered by his PCP. -Also referred him to cardiology due to the CT scan.       Orders placed this encounter:  Orders Placed This Encounter  Procedures  . CT CHEST NODULE FOLLOW UP LOW DOSE WO CM  . CBC with Differential/Platelet  . Lactate dehydrogenase  . CBC with Differential/Platelet  . Comprehensive metabolic panel  . Vitamin B12  . VITAMIN D 25 Hydroxy (Vit-D Deficiency, Fractures)      Francene Finders, FNP-C [place] [phone]

## 2019-02-08 NOTE — Assessment & Plan Note (Addendum)
1.  Leukocytosis: - Patient had mildly elevated white count since 2017.  This was predominantly lymphocytes and monocytes and occasional eosinophils. -Jak 2 V6 17F and BCR-ABL testing on 10/01/2015 was negative. -Bone marrow biopsy on 11/06/2015 showed hypercellular bone marrow with trilineage hematopoiesis with mild polyclonal plasmacytosis.  Abundant megakaryocytes some of which displayed abnormal morphology with large and hypochromic cells.  No increased blast.  Normal chromosome analysis. - He denies any B symptoms.  Elevated white count likely related to history of smoking although smoking-related leukocytosis is predominantly neutrophils. -Labs on 02/08/2019 showed his white count 10.4 -We will see him back in 6 months with labs.  2.  Thrombocytosis: - Patient has had increased platelets off and on since 2017. -Labs on 02/08/2019 showed his platelets 473. -We will recheck them in 6 months.  2.  Smoking history: -CT chest low-dose lung cancer screening on 01/11/2018 was a lung RADS 2. -CT low-dose lung screening done on 02/02/2019 was long RADS category 3S.  He will need a follow-up nodule screening in 6 months.  This was ordered by his PCP. -Also referred him to cardiology due to the CT scan.  

## 2019-02-24 NOTE — Progress Notes (Signed)
CARDIOLOGY CONSULT NOTE       Patient ID: Logan Carey MRN: [id number] DOB/AGE: 1951/02/17 68 y.o.  Admit date: (Not on file) Referring Physician: Ahmed Prima Primary Physician: Renee Rival, NP Primary Cardiologist: New Reason for Consultation: Abnormal chest CT/ CAD  Active Problems:   * No active hospital problems. *   HPI:  68 y.o. history of smoking  DM, HLD, HTN and hematologic abnormalities with lymphocytosis/thrombocytosis  He has had lung cancer screening CT due to his smoking 43 pack-year. Reviewed CT from 02/02/19 left lower lobe Stable 6 mm and multiple other smaller nodules Also commented on aortic atherosclerosis, calcification of the aortic valve and 2 vessel coronary calcification LAD/RCA he has no clinical heart disease No recent A1c or LDL to review   He denies SSCP but wife says he has pains walking at times No intension to stop smoking  Mild cough Compliant with meds   Originally from [place] moved to [facility] age 89 [employer] fan    ROS All other systems reviewed and negative except as noted above  Past Medical History:  Diagnosis Date  . Diabetes mellitus without complication ([place])   . High cholesterol   . Hypertension   . Leukocytosis 10/01/2015  . Lymphocytosis 10/01/2015  . Thrombocytosis ([place]) 10/01/2015    Family History  Problem Relation Age of Onset  . Stroke Father   . Cancer Father 4       Lung cancer-smoker  . Hypertension Sister   . Hypertension Brother   . Hyperlipidemia Son     Social History   Socioeconomic History  . Marital status: Married    Spouse name: Not on file  . Number of children: Not on file  . Years of education: Not on file  . Highest education level: Not on file  Occupational History  . Not on file  Social Needs  . Financial resource strain: Not on file  . Food insecurity    Worry: Not on file    Inability: Not on file  . Transportation needs    Medical: Not on file    Non-medical: Not on  file  Tobacco Use  . Smoking status: Current Every Day Smoker    Packs/day: 0.75    Years: 56.00    Pack years: 42.00    Types: Cigarettes  . Smokeless tobacco: Never Used  Substance and Sexual Activity  . Alcohol use: No    Comment: Use to drink 1/2 of a 5th of [employer] every weekend quitting in ~ 1977  . Drug use: No  . Sexual activity: Not on file  Lifestyle  . Physical activity    Days per week: Not on file    Minutes per session: Not on file  . Stress: Not on file  Relationships  . Social Herbalist on phone: Not on file    Gets together: Not on file    Attends religious service: Not on file    Active member of club or organization: Not on file    Attends meetings of clubs or organizations: Not on file    Relationship status: Not on file  . Intimate partner violence    Fear of current or ex partner: Not on file    Emotionally abused: Not on file    Physically abused: Not on file    Forced sexual activity: Not on file  Other Topics Concern  . Not on file  Social History Narrative  . Not on  file    Past Surgical History:  Procedure Laterality Date  . CHOLECYSTECTOMY        Physical Exam: There were no vitals taken for this visit.    Affect appropriate Healthy:  appears stated age 52: normal Neck supple with no adenopathy JVP normal no bruits no thyromegaly Lungs clear with no wheezing and good diaphragmatic motion Heart:  S1/S2 no murmur, no rub, gallop or click PMI normal Abdomen: benighn, BS positve, no tenderness, no AAA post cholecystectomy  no bruit.  No HSM or HJR Distal pulses intact with no bruits No edema Neuro non-focal Skin warm and dry No muscular weakness   Labs:   Lab Results  Component Value Date   WBC 10.4 02/08/2019   HGB 15.6 02/08/2019   HCT 48.0 02/08/2019   MCV 93.2 02/08/2019   PLT 473 (H) 02/08/2019     Radiology: Ct Chest Lung Ca Screen Low Dose W/o Cm  Result Date: 02/03/2019 CLINICAL DATA:   68 year old male current smoker with 43 pack-year history of smoking. Lung cancer screening examination. EXAM: CT CHEST WITHOUT CONTRAST LOW-DOSE FOR LUNG CANCER SCREENING TECHNIQUE: Multidetector CT imaging of the chest was performed following the standard protocol without IV contrast. COMPARISON:  Low-dose lung cancer screening chest CT 01/08/2018. FINDINGS: Cardiovascular: Heart size is normal. There is no significant pericardial fluid, thickening or pericardial calcification. There is aortic atherosclerosis, as well as atherosclerosis of the great vessels of the mediastinum and the coronary arteries, including calcified atherosclerotic plaque in the left anterior descending and right coronary arteries. Calcifications of the aortic valve. Mediastinum/Nodes: No pathologically enlarged mediastinal or hilar lymph nodes. Please note that accurate exclusion of hilar adenopathy is limited on noncontrast CT scans. Esophagus is unremarkable in appearance. No axillary lymphadenopathy. Lungs/Pleura: Multiple small pulmonary nodules are noted in the lungs bilaterally, largest of which is a new nodule in the left lower lobe (axial image 212 of series 3), with a volume derived mean diameter of 5.9 mm. No larger more suspicious appearing pulmonary nodules or masses are noted. No acute consolidative airspace disease. No pleural effusions. Diffuse bronchial wall thickening with mild centrilobular and paraseptal emphysema. Upper Abdomen: Aortic atherosclerosis.  Status post cholecystectomy. Musculoskeletal: There are no aggressive appearing lytic or blastic lesions noted in the visualized portions of the skeleton. IMPRESSION: 1. Lung-RADS 3S, probably benign findings. Short-term follow-up in 6 months is recommended with repeat low-dose chest CT without contrast (please use the following order, "CT CHEST LCS NODULE FOLLOW-UP W/O CM"). 2. The "S" modifier above refers to potentially clinically significant non lung cancer related  findings. Specifically, there is aortic atherosclerosis, in addition to 2 vessel coronary artery disease. Please note that although the presence of coronary artery calcium documents the presence of coronary artery disease, the severity of this disease and any potential stenosis cannot be assessed on this non-gated CT examination. Assessment for potential risk factor modification, dietary therapy or pharmacologic therapy may be warranted, if clinically indicated. 3. Mild diffuse bronchial wall thickening with mild centrilobular and paraseptal emphysema; imaging findings suggestive of underlying COPD. 4. There are calcifications of the aortic valve. Echocardiographic correlation for evaluation of potential valvular dysfunction may be warranted if clinically indicated. Aortic Atherosclerosis (ICD10-I70.0) and Emphysema (ICD10-J43.9). Electronically Signed   By: Vinnie Langton M.D.   On: 02/03/2019 09:09    EKG: none to review    ASSESSMENT AND PLAN:   1. CAD: subclinical seen on CT scan with multiple coronary risk factors f/u exercise myovue 2. HTN:  Well controlled.  Continue current medications and low sodium Dash type diet.   3. HLD continue pravachol target LDL 70 or less given atherosclerosis on CT 4. DM:  Discussed low carb diet.  Target hemoglobin A1c is 6.5 or less.  Continue current medications. 5. Smoking counseled on cessation and relationship to "hardening of the arteries" seen on his CT F/u primary and continue with lung cancer screening CT  Signed: Jenkins Rouge 02/28/2019, 2:16 PM

## 2019-02-28 ENCOUNTER — Encounter: Payer: Self-pay | Admitting: Cardiovascular Disease

## 2019-02-28 ENCOUNTER — Other Ambulatory Visit: Payer: Self-pay

## 2019-02-28 ENCOUNTER — Ambulatory Visit: Payer: Medicare Other | Admitting: Cardiovascular Disease

## 2019-02-28 VITALS — BP 154/81 | HR 70 | Temp 97.5°F | Ht 70.0 in | Wt 187.0 lb

## 2019-02-28 DIAGNOSIS — I2510 Atherosclerotic heart disease of native coronary artery without angina pectoris: Secondary | ICD-10-CM | POA: Diagnosis not present

## 2019-02-28 DIAGNOSIS — R079 Chest pain, unspecified: Secondary | ICD-10-CM

## 2019-02-28 NOTE — Patient Instructions (Addendum)
Medication Instructions:  Your physician recommends that you continue on your current medications as directed. Please refer to the Current Medication list given to you today.   Labwork: NONE  Testing/Procedures: Your physician has requested that you have en exercise stress myoview. For further information please visit [email or web address]. Please follow instruction sheet, as given.    Follow-Up: Your physician wants you to follow-up in: 1 YEAR .  You will receive a reminder letter in the mail two months in advance. If you don't receive a letter, please call our office to schedule the follow-up appointment.   Any Other Special Instructions Will Be Listed Below (If Applicable).     If you need a refill on your cardiac medications before your next appointment, please call your pharmacy.   

## 2019-03-02 ENCOUNTER — Other Ambulatory Visit (HOSPITAL_COMMUNITY): Payer: Self-pay | Admitting: *Deleted

## 2019-03-08 ENCOUNTER — Other Ambulatory Visit: Payer: Self-pay

## 2019-03-08 ENCOUNTER — Other Ambulatory Visit (HOSPITAL_COMMUNITY)
Admission: RE | Admit: 2019-03-08 | Discharge: 2019-03-08 | Disposition: A | Discharge status: Home / Self Care | Payer: Medicare Other | Source: Ambulatory Visit | Attending: Cardiovascular Disease | Admitting: Cardiovascular Disease

## 2019-03-08 ENCOUNTER — Telehealth: Payer: Self-pay | Admitting: Cardiovascular Disease

## 2019-03-08 NOTE — Telephone Encounter (Signed)
Called pt several times. No answer, unable to leave message. Did send him a message in [place]. Scheduled covid test.

## 2019-03-08 NOTE — Telephone Encounter (Signed)
Silva Bandy called stating this pt needed a COVID test

## 2019-03-09 ENCOUNTER — Other Ambulatory Visit: Payer: Self-pay

## 2019-03-09 ENCOUNTER — Ambulatory Visit (HOSPITAL_COMMUNITY)
Admission: RE | Admit: 2019-03-09 | Discharge: 2019-03-09 | Disposition: A | Discharge status: Home / Self Care | Payer: Medicare Other | Source: Ambulatory Visit | Attending: Cardiovascular Disease | Admitting: Cardiovascular Disease

## 2019-03-09 ENCOUNTER — Encounter (HOSPITAL_COMMUNITY)
Admission: RE | Admit: 2019-03-09 | Discharge: 2019-03-09 | Disposition: A | Discharge status: Home / Self Care | Payer: Medicare Other | Source: Ambulatory Visit | Attending: Cardiovascular Disease | Admitting: Cardiovascular Disease

## 2019-03-09 ENCOUNTER — Encounter (HOSPITAL_COMMUNITY): Payer: Self-pay

## 2019-03-09 DIAGNOSIS — R079 Chest pain, unspecified: Secondary | ICD-10-CM

## 2019-03-09 LAB — NM MYOCAR MULTI W/SPECT W/WALL MOTION / EF
LV dias vol: 93 mL (ref 62–150)
LV sys vol: 37 mL
Peak HR: 89 {beats}/min
RATE: 0.36
Rest HR: 66 {beats}/min
SDS: 1
SRS: 1
SSS: 2
TID: 1.6

## 2019-03-09 MED ORDER — TECHNETIUM TC 99M TETROFOSMIN IV KIT
30.0000 | PACK | Freq: Once | INTRAVENOUS | Status: AC | PRN
  Administered 2019-03-09: 31.5 via INTRAVENOUS

## 2019-03-09 MED ORDER — REGADENOSON 0.4 MG/5ML IV SOLN
INTRAVENOUS | Status: AC
  Administered 2019-03-09: 12:00:00 0.4 mg via INTRAVENOUS
  Filled 2019-03-09: qty 5

## 2019-03-09 MED ORDER — SODIUM CHLORIDE 0.9% FLUSH
INTRAVENOUS | Status: AC
  Administered 2019-03-09: 10 mL via INTRAVENOUS
  Filled 2019-03-09: qty 10

## 2019-03-09 MED ORDER — TECHNETIUM TC 99M TETROFOSMIN IV KIT
10.0000 | PACK | Freq: Once | INTRAVENOUS | Status: AC | PRN
  Administered 2019-03-09: 9.1 via INTRAVENOUS

## 2019-07-13 DIAGNOSIS — E782 Mixed hyperlipidemia: Secondary | ICD-10-CM | POA: Diagnosis not present

## 2019-07-13 DIAGNOSIS — I10 Essential (primary) hypertension: Secondary | ICD-10-CM | POA: Diagnosis not present

## 2019-07-13 DIAGNOSIS — Z79899 Other long term (current) drug therapy: Secondary | ICD-10-CM | POA: Diagnosis not present

## 2019-07-13 DIAGNOSIS — D72829 Elevated white blood cell count, unspecified: Secondary | ICD-10-CM | POA: Diagnosis not present

## 2019-07-13 DIAGNOSIS — R9389 Abnormal findings on diagnostic imaging of other specified body structures: Secondary | ICD-10-CM | POA: Diagnosis not present

## 2019-07-13 DIAGNOSIS — Z713 Dietary counseling and surveillance: Secondary | ICD-10-CM | POA: Diagnosis not present

## 2019-07-13 DIAGNOSIS — Z794 Long term (current) use of insulin: Secondary | ICD-10-CM | POA: Diagnosis not present

## 2019-07-13 DIAGNOSIS — E119 Type 2 diabetes mellitus without complications: Secondary | ICD-10-CM | POA: Diagnosis not present

## 2019-07-21 DIAGNOSIS — M545 Low back pain: Secondary | ICD-10-CM | POA: Diagnosis not present

## 2019-07-21 DIAGNOSIS — Z79891 Long term (current) use of opiate analgesic: Secondary | ICD-10-CM | POA: Diagnosis not present

## 2019-07-21 DIAGNOSIS — G4459 Other complicated headache syndrome: Secondary | ICD-10-CM | POA: Diagnosis not present

## 2019-07-21 DIAGNOSIS — M25552 Pain in left hip: Secondary | ICD-10-CM | POA: Diagnosis not present

## 2019-07-21 DIAGNOSIS — M79605 Pain in left leg: Secondary | ICD-10-CM | POA: Diagnosis not present

## 2019-08-08 ENCOUNTER — Other Ambulatory Visit: Payer: Self-pay

## 2019-08-08 ENCOUNTER — Inpatient Hospital Stay (HOSPITAL_COMMUNITY): Payer: Medicare HMO | Attending: Hematology

## 2019-08-08 ENCOUNTER — Ambulatory Visit (HOSPITAL_COMMUNITY)
Admission: RE | Admit: 2019-08-08 | Discharge: 2019-08-08 | Disposition: A | Discharge status: Home / Self Care | Payer: Medicare HMO | Source: Ambulatory Visit | Attending: Nurse Practitioner | Admitting: Nurse Practitioner

## 2019-08-08 DIAGNOSIS — Z122 Encounter for screening for malignant neoplasm of respiratory organs: Secondary | ICD-10-CM | POA: Diagnosis not present

## 2019-08-08 DIAGNOSIS — F17210 Nicotine dependence, cigarettes, uncomplicated: Secondary | ICD-10-CM | POA: Insufficient documentation

## 2019-08-08 DIAGNOSIS — Z87891 Personal history of nicotine dependence: Secondary | ICD-10-CM | POA: Diagnosis not present

## 2019-08-08 DIAGNOSIS — F17200 Nicotine dependence, unspecified, uncomplicated: Secondary | ICD-10-CM | POA: Insufficient documentation

## 2019-08-08 DIAGNOSIS — R69 Illness, unspecified: Secondary | ICD-10-CM | POA: Diagnosis not present

## 2019-08-08 DIAGNOSIS — J439 Emphysema, unspecified: Secondary | ICD-10-CM | POA: Diagnosis not present

## 2019-08-08 DIAGNOSIS — D473 Essential (hemorrhagic) thrombocythemia: Secondary | ICD-10-CM | POA: Insufficient documentation

## 2019-08-08 DIAGNOSIS — D72829 Elevated white blood cell count, unspecified: Secondary | ICD-10-CM | POA: Insufficient documentation

## 2019-08-09 ENCOUNTER — Inpatient Hospital Stay (HOSPITAL_COMMUNITY): Payer: Medicare HMO

## 2019-08-09 DIAGNOSIS — R69 Illness, unspecified: Secondary | ICD-10-CM | POA: Diagnosis not present

## 2019-08-09 DIAGNOSIS — F17210 Nicotine dependence, cigarettes, uncomplicated: Secondary | ICD-10-CM | POA: Diagnosis not present

## 2019-08-09 DIAGNOSIS — D72829 Elevated white blood cell count, unspecified: Secondary | ICD-10-CM | POA: Diagnosis present

## 2019-08-09 DIAGNOSIS — D473 Essential (hemorrhagic) thrombocythemia: Secondary | ICD-10-CM | POA: Diagnosis not present

## 2019-08-09 DIAGNOSIS — D72820 Lymphocytosis (symptomatic): Secondary | ICD-10-CM

## 2019-08-09 LAB — CBC WITH DIFFERENTIAL/PLATELET
Abs Immature Granulocytes: 0.08 10*3/uL — ABNORMAL HIGH (ref 0.00–0.07)
Basophils Absolute: 0.1 10*3/uL (ref 0.0–0.1)
Basophils Relative: 1 %
Eosinophils Absolute: 0.6 10*3/uL — ABNORMAL HIGH (ref 0.0–0.5)
Eosinophils Relative: 5 %
HCT: 43.9 % (ref 39.0–52.0)
Hemoglobin: 14.5 g/dL (ref 13.0–17.0)
Immature Granulocytes: 1 %
Lymphocytes Relative: 35 %
Lymphs Abs: 3.9 10*3/uL (ref 0.7–4.0)
MCH: 30.4 pg (ref 26.0–34.0)
MCHC: 33 g/dL (ref 30.0–36.0)
MCV: 92 fL (ref 80.0–100.0)
Monocytes Absolute: 1.1 10*3/uL — ABNORMAL HIGH (ref 0.1–1.0)
Monocytes Relative: 10 %
Neutro Abs: 5.4 10*3/uL (ref 1.7–7.7)
Neutrophils Relative %: 48 %
Platelets: 376 10*3/uL (ref 150–400)
RBC: 4.77 MIL/uL (ref 4.22–5.81)
RDW: 14.1 % (ref 11.5–15.5)
WBC: 11.2 10*3/uL — ABNORMAL HIGH (ref 4.0–10.5)
nRBC: 0 % (ref 0.0–0.2)

## 2019-08-09 LAB — COMPREHENSIVE METABOLIC PANEL
ALT: 26 U/L (ref 0–44)
AST: 20 U/L (ref 15–41)
Albumin: 4.1 g/dL (ref 3.5–5.0)
Alkaline Phosphatase: 63 U/L (ref 38–126)
Anion gap: 8 (ref 5–15)
BUN: 17 mg/dL (ref 8–23)
CO2: 24 mmol/L (ref 22–32)
Calcium: 9.8 mg/dL (ref 8.9–10.3)
Chloride: 103 mmol/L (ref 98–111)
Creatinine, Ser: 0.8 mg/dL (ref 0.61–1.24)
GFR calc Af Amer: >60 mL/min (ref >60)
GFR calc non Af Amer: >60 mL/min (ref >60)
Glucose, Bld: 215 mg/dL — ABNORMAL HIGH (ref 70–99)
Potassium: 4.4 mmol/L (ref 3.5–5.1)
Sodium: 135 mmol/L (ref 135–145)
Total Bilirubin: 0.5 mg/dL (ref 0.3–1.2)
Total Protein: 7.3 g/dL (ref 6.5–8.1)

## 2019-08-09 LAB — VITAMIN B12: Vitamin B-12: 400 pg/mL (ref 180–914)

## 2019-08-09 LAB — LACTATE DEHYDROGENASE: LDH: 128 U/L (ref 98–192)

## 2019-08-09 LAB — VITAMIN D 25 HYDROXY (VIT D DEFICIENCY, FRACTURES): Vit D, 25-Hydroxy: 20.71 ng/mL — ABNORMAL LOW (ref 30–100)

## 2019-08-09 NOTE — Progress Notes (Signed)
Please call patient and let them  know their  low dose Ct was read as a Lung RADS 2: nodules that are benign in appearance and behavior with a very low likelihood of becoming a clinically active cancer due to size or lack of growth. Recommendation per radiology is for a repeat LDCT in 12 months. .Please let them  know we will order and schedule their  annual screening scan for 07/2020. Please let them  know there was notation of CAD on their  scan.  Please remind the patient  that this is a non-gated exam therefore degree or severity of disease  cannot be determined. Please have them  follow up with their PCP regarding potential risk factor modification, dietary therapy or pharmacologic therapy if clinically indicated. Pt.  is  currently on statin therapy. Please place order for annual  screening scan for  07/2020 and fax results to PCP. Thanks so much.

## 2019-08-10 ENCOUNTER — Other Ambulatory Visit: Payer: Self-pay | Admitting: *Deleted

## 2019-08-10 DIAGNOSIS — F17210 Nicotine dependence, cigarettes, uncomplicated: Secondary | ICD-10-CM

## 2019-08-10 DIAGNOSIS — Z87891 Personal history of nicotine dependence: Secondary | ICD-10-CM

## 2019-08-11 ENCOUNTER — Other Ambulatory Visit: Payer: Self-pay

## 2019-08-11 ENCOUNTER — Inpatient Hospital Stay (HOSPITAL_COMMUNITY): Payer: Medicare HMO | Admitting: Nurse Practitioner

## 2019-08-11 ENCOUNTER — Encounter (HOSPITAL_COMMUNITY): Payer: Self-pay | Admitting: Nurse Practitioner

## 2019-08-11 DIAGNOSIS — D72820 Lymphocytosis (symptomatic): Secondary | ICD-10-CM

## 2019-08-11 DIAGNOSIS — D473 Essential (hemorrhagic) thrombocythemia: Secondary | ICD-10-CM | POA: Diagnosis not present

## 2019-08-11 DIAGNOSIS — R69 Illness, unspecified: Secondary | ICD-10-CM | POA: Diagnosis not present

## 2019-08-11 DIAGNOSIS — D72829 Elevated white blood cell count, unspecified: Secondary | ICD-10-CM | POA: Diagnosis not present

## 2019-08-11 NOTE — Progress Notes (Signed)
[name], [place] [address]   CLINIC:  Medical Oncology/Hematology  PCP:  Renee Rival, NP [address] [place] [address] [phone]   REASON FOR VISIT: Follow-up for leukocytosis and thrombocytosis  CURRENT THERAPY: Observation   INTERVAL HISTORY:  [name] 69 y.o. male returns for routine follow-up for leukocytosis and thrombocytosis.  Patient reports he has been doing well since last visit he denies any new pains.  He denies any bright red bleeding per rectum or melena.  He denies any easy bruising or bleeding. Denies any nausea, vomiting, or diarrhea. Denies any new pains. Had not noticed any recent bleeding such as epistaxis, hematuria or hematochezia. Denies recent chest pain on exertion, shortness of breath on minimal exertion, pre-syncopal episodes, or palpitations. Denies any numbness or tingling in hands or feet. Denies any recent fevers, infections, or recent hospitalizations. Patient reports appetite at 100% and energy level at 100%.  He is eating well maintain his weight at this time.     REVIEW OF SYSTEMS:  Review of Systems  Neurological: Positive for headaches.  All other systems reviewed and are negative.    PAST MEDICAL/SURGICAL HISTORY:  Past Medical History:  Diagnosis Date  . Diabetes mellitus without complication ([place])   . High cholesterol   . Hypertension   . Leukocytosis 10/01/2015  . Lymphocytosis 10/01/2015  . Thrombocytosis ([place]) 10/01/2015   Past Surgical History:  Procedure Laterality Date  . CHOLECYSTECTOMY       SOCIAL HISTORY:  Social History   Socioeconomic History  . Marital status: Married    Spouse name: Not on file  . Number of children: Not on file  . Years of education: Not on file  . Highest education level: Not on file  Occupational History  . Not on file  Tobacco Use  . Smoking status: Current Every Day Smoker    Packs/day: 0.25    Years: 56.00    Pack years: 14.00      Types: Cigarettes  . Smokeless tobacco: Never Used  Substance and Sexual Activity  . Alcohol use: No    Comment: Use to drink 1/2 of a 5th of [employer] every weekend quitting in ~ 1977  . Drug use: No  . Sexual activity: Not on file  Other Topics Concern  . Not on file  Social History Narrative  . Not on file   Social Determinants of Health   Financial Resource Strain:   . Difficulty of Paying Living Expenses:   Food Insecurity:   . Worried About Charity fundraiser in the Last Year:   . Arboriculturist in the Last Year:   Transportation Needs:   . Film/video editor (Medical):   Marland Kitchen Lack of Transportation (Non-Medical):   Physical Activity:   . Days of Exercise per Week:   . Minutes of Exercise per Session:   Stress:   . Feeling of Stress :   Social Connections:   . Frequency of Communication with Friends and Family:   . Frequency of Social Gatherings with Friends and Family:   . Attends Religious Services:   . Active Member of Clubs or Organizations:   . Attends Archivist Meetings:   Marland Kitchen Marital Status:   Intimate Partner Violence:   . Fear of Current or Ex-Partner:   . Emotionally Abused:   Marland Kitchen Physically Abused:   . Sexually Abused:     FAMILY HISTORY:  Family History  Problem Relation Age of Onset  . Stroke Father   . Cancer Father 61       Lung cancer-smoker  . Hypertension Sister   . Hypertension Brother   . Hyperlipidemia Son     CURRENT MEDICATIONS:  Outpatient Encounter Medications as of 08/11/2019  Medication Sig Note  . aspirin 81 MG tablet Take 81 mg by mouth daily.   Marland Kitchen atorvastatin (LIPITOR) 40 MG tablet Take 40 mg by mouth daily.   . benazepril (LOTENSIN) 20 MG tablet Take 20 mg by mouth 2 (two) times daily. 10/01/2015: Received from: External Pharmacy Received Sig:   . buPROPion (WELLBUTRIN XL) 300 MG 24 hr tablet    . diazepam (VALIUM) 5 MG tablet Take 5 mg by mouth 3 (three) times daily. 10/01/2015: Received from: External Pharmacy  Received Sig:   . fenofibrate 160 MG tablet Take 160 mg by mouth daily. 10/01/2015: Received from: External Pharmacy Received Sig:   . gabapentin (NEURONTIN) 400 MG capsule    . HUMALOG KWIKPEN 100 UNIT/ML KiwkPen Inject 35 Units into the skin 3 (three) times daily. 10/01/2015: Received from: External Pharmacy Received Sig:   . hydrochlorothiazide (HYDRODIURIL) 25 MG tablet    . insulin detemir (LEVEMIR) 100 UNIT/ML injection Inject 70 Units into the skin at bedtime.   . metFORMIN (GLUCOPHAGE-XR) 500 MG 24 hr tablet Take 500 mg by mouth. 4 tabs daily with supper 10/01/2015: Received from: External Pharmacy Received Sig:   . NOVOTWIST 32G X 5 MM MISC  10/01/2015: Received from: External Pharmacy  . ONE TOUCH ULTRA TEST test strip  10/01/2015: Received from: External Pharmacy  . oxyCODONE-acetaminophen (PERCOCET) 10-325 MG tablet Take 10-325 tablets by mouth every 8 (eight) hours as needed. 10/01/2015: Received from: External Pharmacy Received Sig:   . pravastatin (PRAVACHOL) 20 MG tablet Take 20 mg by mouth at bedtime. 10/01/2015: Received from: External Pharmacy Received Sig:   . tiZANidine (ZANAFLEX) 4 MG tablet    . topiramate (TOPAMAX) 50 MG tablet Take 60 mg by mouth 2 (two) times daily. Patient reports topamax has recently changed to 60 mg twice daily    No facility-administered encounter medications on file as of 08/11/2019.    ALLERGIES:  No Known Allergies   PHYSICAL EXAM:  ECOG Performance status: 1  Vitals:   08/11/19 1300  BP: (!) 156/77  Pulse: 67  Resp: 20  Temp: 97.6 F (36.4 C)  SpO2: 98%   Filed Weights   08/11/19 1300  Weight: 193 lb (87.5 kg)    Physical Exam Constitutional:      Appearance: Normal appearance. He is normal weight.  Cardiovascular:     Rate and Rhythm: Normal rate and regular rhythm.     Heart sounds: Normal heart sounds.  Pulmonary:     Effort: Pulmonary effort is normal.     Breath sounds: Normal breath sounds.  Abdominal:     General: Bowel  sounds are normal.     Palpations: Abdomen is soft.  Musculoskeletal:        General: Normal range of motion.  Skin:    General: Skin is warm.  Neurological:     Mental Status: He is alert and oriented to person, place, and time. Mental status is at baseline.  Psychiatric:        Mood and Affect: Mood normal.        Behavior: Behavior normal.        Thought Content: Thought content normal.        Judgment:  Judgment normal.      LABORATORY DATA:  I have reviewed the labs as listed.  CBC    Component Value Date/Time   WBC 11.2 (H) 08/09/2019 1115   RBC 4.77 08/09/2019 1115   HGB 14.5 08/09/2019 1115   HCT 43.9 08/09/2019 1115   PLT 376 08/09/2019 1115   MCV 92.0 08/09/2019 1115   MCH 30.4 08/09/2019 1115   MCHC 33.0 08/09/2019 1115   RDW 14.1 08/09/2019 1115   LYMPHSABS 3.9 08/09/2019 1115   MONOABS 1.1 (H) 08/09/2019 1115   EOSABS 0.6 (H) 08/09/2019 1115   BASOSABS 0.1 08/09/2019 1115   CMP Latest Ref Rng & Units 08/09/2019 02/08/2019 11/29/2018  Glucose 70 - 99 mg/dL 215(H) 100(H) 57(L)  BUN 8 - 23 mg/dL 17 23 27(H)  Creatinine 0.61 - 1.24 mg/dL 0.80 1.02 0.91  Sodium 135 - 145 mmol/L 135 139 140  Potassium 3.5 - 5.1 mmol/L 4.4 3.6 3.8  Chloride 98 - 111 mmol/L 103 106 106  CO2 22 - 32 mmol/L 24 24 26   Calcium 8.9 - 10.3 mg/dL 9.8 9.7 9.8  Total Protein 6.5 - 8.1 g/dL 7.3 7.7 7.2  Total Bilirubin 0.3 - 1.2 mg/dL 0.5 0.6 0.2(L)  Alkaline Phos 38 - 126 U/L 63 76 63  AST 15 - 41 U/L 20 20 20   ALT 0 - 44 U/L 26 23 30      I personally performed a face-to-face visit,   All questions were answered to patient's stated satisfaction. Encouraged patient to call with any new concerns or questions before his next visit to the cancer center and we can certain see him sooner, if needed.     ASSESSMENT & PLAN:   Leukocytosis 1.  Leukocytosis: - Patient had mildly elevated white count since 2017.  This was predominantly lymphocytes and monocytes and occasional  eosinophils. -Jak 2 V6 88F and BCR-ABL testing on 10/01/2015 was negative. -Bone marrow biopsy on 11/06/2015 showed hypercellular bone marrow with trilineage hematopoiesis with mild polyclonal plasmacytosis.  Abundant megakaryocytes some of which displayed abnormal morphology with large and hypochromic cells.  No increased blast.  Normal chromosome analysis. - He denies any B symptoms.  Elevated white count likely related to history of smoking although smoking-related leukocytosis is predominantly neutrophils. -Labs on 08/09/2019 showed his white count 11.2 -We will see him back in 6 months with labs.  2.  Thrombocytosis: - Patient has had increased platelets off and on since 2017. -Labs on 08/09/2019 showed his platelets 376 -We will recheck them in 6 months.  2.  Smoking history: -CT chest low-dose lung cancer screening on 01/11/2018 was a lung RADS 2. -CT low-dose lung screening done on 02/02/2019 was long RADS category 3S.  He will need a follow-up nodule screening in 6 months.  This was ordered by his PCP. -Also referred him to cardiology due to the CT scan.  Patient saw cardiology and stress test was complete he will follow-up with them as needed.       Orders placed this encounter:  Orders Placed This Encounter  Procedures  . Lactate dehydrogenase  . CBC with Differential/Platelet  . Comprehensive metabolic panel  . Vitamin B12  . VITAMIN D 25 Hydroxy (Vit-D Deficiency, Fractures)  . Folate      Francene Finders, FNP-C [place] [phone]

## 2019-08-11 NOTE — Assessment & Plan Note (Signed)
1.  Leukocytosis: - Patient had mildly elevated white count since 2017.  This was predominantly lymphocytes and monocytes and occasional eosinophils. -Jak 2 V6 74F and BCR-ABL testing on 10/01/2015 was negative. -Bone marrow biopsy on 11/06/2015 showed hypercellular bone marrow with trilineage hematopoiesis with mild polyclonal plasmacytosis.  Abundant megakaryocytes some of which displayed abnormal morphology with large and hypochromic cells.  No increased blast.  Normal chromosome analysis. - He denies any B symptoms.  Elevated white count likely related to history of smoking although smoking-related leukocytosis is predominantly neutrophils. -Labs on 08/09/2019 showed his white count 11.2 -We will see him back in 6 months with labs.  2.  Thrombocytosis: - Patient has had increased platelets off and on since 2017. -Labs on 08/09/2019 showed his platelets 376 -We will recheck them in 6 months.  2.  Smoking history: -CT chest low-dose lung cancer screening on 01/11/2018 was a lung RADS 2. -CT low-dose lung screening done on 02/02/2019 was long RADS category 3S.  He will need a follow-up nodule screening in 6 months.  This was ordered by his PCP. -Also referred him to cardiology due to the CT scan.  Patient saw cardiology and stress test was complete he will follow-up with them as needed.

## 2019-08-11 NOTE — Patient Instructions (Signed)
[facility] at [facility] Discharge Instructions  Follow up in 6 months with labs    Thank you for choosing [facility] at [facility] to provide your oncology and hematology care.  To afford each patient quality time with our provider, please arrive at least 15 minutes before your scheduled appointment time.   If you have a lab appointment with the Cancer Center please come in thru the Main Entrance and check in at the main information desk.  You need to re-schedule your appointment should you arrive 10 or more minutes late.  We strive to give you quality time with our providers, and arriving late affects you and other patients whose appointments are after yours.  Also, if you no show three or more times for appointments you may be dismissed from the clinic at the providers discretion.     Again, thank you for choosing [facility].  Our hope is that these requests will decrease the amount of time that you wait before being seen by our physicians.       _____________________________________________________________  Should you have questions after your visit to [facility], please contact our office at [phone] between the hours of 8:00 a.m. and 4:30 p.m.  Voicemails left after 4:00 p.m. will not be returned until the following business day.  For prescription refill requests, have your pharmacy contact our office and allow 72 hours.    Due to Covid, you will need to wear a mask upon entering the hospital. If you do not have a mask, a mask will be given to you at the Main Entrance upon arrival. For doctor visits, patients may have 1 support person with them. For treatment visits, patients can not have anyone with them due to social distancing guidelines and our immunocompromised population.      

## 2019-09-28 ENCOUNTER — Other Ambulatory Visit: Payer: Self-pay

## 2019-09-28 ENCOUNTER — Encounter: Payer: Medicare HMO | Attending: Nurse Practitioner | Admitting: Nutrition

## 2019-09-28 DIAGNOSIS — E1165 Type 2 diabetes mellitus with hyperglycemia: Secondary | ICD-10-CM | POA: Diagnosis not present

## 2019-09-28 DIAGNOSIS — E782 Mixed hyperlipidemia: Secondary | ICD-10-CM | POA: Insufficient documentation

## 2019-09-28 DIAGNOSIS — I10 Essential (primary) hypertension: Secondary | ICD-10-CM | POA: Diagnosis not present

## 2019-09-28 DIAGNOSIS — E118 Type 2 diabetes mellitus with unspecified complications: Secondary | ICD-10-CM | POA: Insufficient documentation

## 2019-09-28 DIAGNOSIS — IMO0002 Reserved for concepts with insufficient information to code with codable children: Secondary | ICD-10-CM

## 2019-09-28 NOTE — Progress Notes (Signed)
  Medical Nutrition Therapy:  Appt start time: 0800 end time:  0900.  Assessment:  Primary concerns today: Diabetes Type 2, Obesity, Hyperlipidemia, HTN, Depression. Lives with his wife.  He does the cooking and shopping. Eats 1-2 meals per day.  Most foods are fried.  Levemir  70 units a day. Humalog 25 units with meals.   Sometimes forgets insulin at meals - once a week. Metformin 500 mg 4 x per day. Testing blood sugars 4 times a day. FBS 144 mgd/ this am (120-140's.) Before lunch 115-130's Before dinner  130's-150's before supper. Drinks diet pepsi.  [employer] 3 times per week. Last A1C 10% in Feb 2021. Takes insulin even when skipping meal. Willing to work on changing eating habits and medications to improve his DM.  Preferred Learning Style:   No preference indicated   Learning Readiness:  Ready  Change in progress   MEDICATIONS:    DIETARY INTAKE:    24-hr recall:  B ( AM):Rice Krispies with banana, whole, Diet Pepsi Snk ( AM): water L ( PM): Grilled Cheese sandwich, Diet Pepsi Snk ( PM): sf cookies, water D ( PM): Popcorn 6 cup microwave bag, Diet Pepsi Snk ( PM): water Beverages: Diet Pepsi, water-20 oz per day.  Usual physical activity:   Estimated energy needs: 1800-2000 calories 225 g carbohydrates 150 g protein 56 g fat  Progress Towards Goal(s):  In progress.   Nutritional Diagnosis:  NB-1.1 Food and nutrition-related knowledge deficit As related to Diabetes Type 2.  As evidenced by A1C 10%..    Intervention:  Nutrition and Diabetes education provided on My Plate, CHO counting, meal planning, portion sizes, timing of meals, avoiding snacks between meals unless having a low blood sugar, target ranges for A1C and blood sugars, signs/symptoms and treatment of hyper/hypoglycemia, monitoring blood sugars, taking medications as prescribed, benefits of exercising 30 minutes per day and prevention of complications of DM.  Goals  Follow My Plate Eat  QA348G g CHO at meals depending on activity Eat three meals a day at times discussed. Do not take meal time insulin if you skip a meal. Drink only water and cut out Diet Pepsi's Get A1C down to 7% or less. Due to possible side effects, recommend to take 2 Metformin( 500 mg each) after breakfast and 2 Metformin (500 mg each) after supper.   Teaching Method Utilized:  Visual Auditory Hands on  Handouts given during visit include:  The Plate Method  Meal Planning card  Diabetes Instructions.     Barriers to learning/adherence to lifestyle change: None  Demonstrated degree of understanding via:  Teach Back   Monitoring/Evaluation:  Dietary intake, exercise, and body weight in 1 month(s). >> Recommend referral to Endocrinology due to his DM.

## 2019-10-10 DIAGNOSIS — Z79899 Other long term (current) drug therapy: Secondary | ICD-10-CM | POA: Diagnosis not present

## 2019-10-10 DIAGNOSIS — E119 Type 2 diabetes mellitus without complications: Secondary | ICD-10-CM | POA: Diagnosis not present

## 2019-10-10 DIAGNOSIS — E782 Mixed hyperlipidemia: Secondary | ICD-10-CM | POA: Diagnosis not present

## 2019-10-10 DIAGNOSIS — D72829 Elevated white blood cell count, unspecified: Secondary | ICD-10-CM | POA: Diagnosis not present

## 2019-10-11 ENCOUNTER — Encounter: Payer: Self-pay | Admitting: Nutrition

## 2019-10-11 DIAGNOSIS — I10 Essential (primary) hypertension: Secondary | ICD-10-CM | POA: Diagnosis not present

## 2019-10-11 DIAGNOSIS — E785 Hyperlipidemia, unspecified: Secondary | ICD-10-CM | POA: Diagnosis not present

## 2019-10-11 DIAGNOSIS — F419 Anxiety disorder, unspecified: Secondary | ICD-10-CM | POA: Diagnosis not present

## 2019-10-11 DIAGNOSIS — Z79891 Long term (current) use of opiate analgesic: Secondary | ICD-10-CM | POA: Diagnosis not present

## 2019-10-11 DIAGNOSIS — Z720 Tobacco use: Secondary | ICD-10-CM | POA: Diagnosis not present

## 2019-10-11 DIAGNOSIS — R69 Illness, unspecified: Secondary | ICD-10-CM | POA: Diagnosis not present

## 2019-10-11 DIAGNOSIS — E1142 Type 2 diabetes mellitus with diabetic polyneuropathy: Secondary | ICD-10-CM | POA: Diagnosis not present

## 2019-10-11 DIAGNOSIS — M2550 Pain in unspecified joint: Secondary | ICD-10-CM | POA: Diagnosis not present

## 2019-10-11 DIAGNOSIS — G8929 Other chronic pain: Secondary | ICD-10-CM | POA: Diagnosis not present

## 2019-10-11 DIAGNOSIS — Z794 Long term (current) use of insulin: Secondary | ICD-10-CM | POA: Diagnosis not present

## 2019-10-11 NOTE — Patient Instructions (Addendum)
Goals  Follow My Plate Eat QA348G g CHO at meals depending on activity Eat three meals a day at times discussed. Do not take meal time insulin if you skip a meal. Drink only water and cut out Diet Pepsi's Get A1C down to 7% or less.

## 2019-10-13 DIAGNOSIS — M25552 Pain in left hip: Secondary | ICD-10-CM | POA: Diagnosis not present

## 2019-10-13 DIAGNOSIS — M79605 Pain in left leg: Secondary | ICD-10-CM | POA: Diagnosis not present

## 2019-10-13 DIAGNOSIS — M25519 Pain in unspecified shoulder: Secondary | ICD-10-CM | POA: Diagnosis not present

## 2019-10-13 DIAGNOSIS — M542 Cervicalgia: Secondary | ICD-10-CM | POA: Diagnosis not present

## 2019-10-13 DIAGNOSIS — Z79891 Long term (current) use of opiate analgesic: Secondary | ICD-10-CM | POA: Diagnosis not present

## 2019-10-13 DIAGNOSIS — G4459 Other complicated headache syndrome: Secondary | ICD-10-CM | POA: Diagnosis not present

## 2019-10-13 DIAGNOSIS — M545 Low back pain: Secondary | ICD-10-CM | POA: Diagnosis not present

## 2019-10-13 DIAGNOSIS — R69 Illness, unspecified: Secondary | ICD-10-CM | POA: Diagnosis not present

## 2019-10-19 ENCOUNTER — Other Ambulatory Visit: Payer: Self-pay

## 2019-10-19 ENCOUNTER — Encounter: Payer: Self-pay | Admitting: Nutrition

## 2019-10-19 ENCOUNTER — Encounter: Payer: Medicare HMO | Attending: Nurse Practitioner | Admitting: Nutrition

## 2019-10-19 VITALS — Ht 70.0 in | Wt 191.0 lb

## 2019-10-19 DIAGNOSIS — I10 Essential (primary) hypertension: Secondary | ICD-10-CM

## 2019-10-19 DIAGNOSIS — E1165 Type 2 diabetes mellitus with hyperglycemia: Secondary | ICD-10-CM | POA: Insufficient documentation

## 2019-10-19 DIAGNOSIS — E118 Type 2 diabetes mellitus with unspecified complications: Secondary | ICD-10-CM | POA: Insufficient documentation

## 2019-10-19 DIAGNOSIS — IMO0002 Reserved for concepts with insufficient information to code with codable children: Secondary | ICD-10-CM

## 2019-10-19 DIAGNOSIS — E669 Obesity, unspecified: Secondary | ICD-10-CM

## 2019-10-19 DIAGNOSIS — E782 Mixed hyperlipidemia: Secondary | ICD-10-CM

## 2019-10-19 NOTE — Progress Notes (Signed)
  Medical Nutrition Therapy:  Appt start time: 0920  end time:  0950  Assessment:  Primary concerns today: Diabetes Type 2, Obesity, Hyperlipidemia, HTN, Depression.  Has been going to [employer]. Twice a week. Working on reducing his diet sodas and eating more vegetables. Feeling better. Working on eating better meals and at more scheduled times.  Taking 70 units of Levermir, Humalog 35 units plus sliding scale with meals, Metformin 500 mg 4 x a day, Suppose to get A1C done soon. PCP Janora Norlander, NP Previous  A1C 10%. 2/21. Oct 10, 2019 9.1 % BS slightly improved. Still needs to work on more fresh fruits and vegetables and cut out snacks.  CMP Latest Ref Rng & Units 08/09/2019 02/08/2019 11/29/2018  Glucose 70 - 99 mg/dL 215(H) 100(H) 57(L)  BUN 8 - 23 mg/dL 17 23 27(H)  Creatinine 0.61 - 1.24 mg/dL 0.80 1.02 0.91  Sodium 135 - 145 mmol/L 135 139 140  Potassium 3.5 - 5.1 mmol/L 4.4 3.6 3.8  Chloride 98 - 111 mmol/L 103 106 106  CO2 22 - 32 mmol/L 24 24 26   Calcium 8.9 - 10.3 mg/dL 9.8 9.7 9.8  Total Protein 6.5 - 8.1 g/dL 7.3 7.7 7.2  Total Bilirubin 0.3 - 1.2 mg/dL 0.5 0.6 0.2(L)  Alkaline Phos 38 - 126 U/L 63 76 63  AST 15 - 41 U/L 20 20 20   ALT 0 - 44 U/L 26 23 30      Preferred Learning Style:   No preference indicated   Learning Readiness:  Ready  Change in progress   MEDICATIONS:    DIETARY INTAKE:    24-hr recall:  B ( AM):Rice Krispies with banana, whole, Diet Pepsi Snk ( AM): water L ( PM): Grilled Cheese sandwich, Diet Pepsi Snk ( PM  D ( PM): Popcorn 6 cup microwave bag, Diet Pepsi Snk ( PM): water Beverages: Diet Pepsi, water-20 oz per day.  Usual physical activity:   Estimated energy needs: 1800-2000 calories 225 g carbohydrates 150 g protein 56 g fat  Progress Towards Goal(s):  In progress.   Nutritional Diagnosis:  NB-1.1 Food and nutrition-related knowledge deficit As related to Diabetes Type 2.  As evidenced by A1C 10%..     Intervention:  Nutrition and Diabetes education provided on My Plate, CHO counting, meal planning, portion sizes, timing of meals, avoiding snacks between meals unless having a low blood sugar, target ranges for A1C and blood sugars, signs/symptoms and treatment of hyper/hypoglycemia, monitoring blood sugars, taking medications as prescribed, benefits of exercising 30 minutes per day and prevention of complications of DM.  Goals  Follow My Plate  Eat egg sandwich for breakfast Dont skip meals. Eat three meals a day at times discussed. Do not take meal time insulin if you skip a meal. Drink only water and cut out Diet Pepsi's Get A1C down to 7% or less. Due to possible side effects, recommend to take 2 Metformin( 500 mg each) after breakfast and 2 Metformin (500 mg each) after supper.  Teaching Method Utilized:  Visual Auditory Hands on  Handouts given during visit include:  The Plate Method  Meal Planning card  Diabetes Instructions.     Barriers to learning/adherence to lifestyle change: None  Demonstrated degree of understanding via:  Teach Back   Monitoring/Evaluation:  Dietary intake, exercise, and body weight  In 3 months. >> Recommend referral to Endocrinology due to his poorly controlled DM.

## 2019-10-25 DIAGNOSIS — R9389 Abnormal findings on diagnostic imaging of other specified body structures: Secondary | ICD-10-CM | POA: Diagnosis not present

## 2019-10-25 DIAGNOSIS — I10 Essential (primary) hypertension: Secondary | ICD-10-CM | POA: Diagnosis not present

## 2019-10-25 DIAGNOSIS — Z713 Dietary counseling and surveillance: Secondary | ICD-10-CM | POA: Diagnosis not present

## 2019-10-25 DIAGNOSIS — Z794 Long term (current) use of insulin: Secondary | ICD-10-CM | POA: Diagnosis not present

## 2019-10-25 DIAGNOSIS — D72829 Elevated white blood cell count, unspecified: Secondary | ICD-10-CM | POA: Diagnosis not present

## 2019-10-25 DIAGNOSIS — Z6827 Body mass index (BMI) 27.0-27.9, adult: Secondary | ICD-10-CM | POA: Diagnosis not present

## 2019-10-25 DIAGNOSIS — Z79899 Other long term (current) drug therapy: Secondary | ICD-10-CM | POA: Diagnosis not present

## 2019-10-25 DIAGNOSIS — E119 Type 2 diabetes mellitus without complications: Secondary | ICD-10-CM | POA: Diagnosis not present

## 2019-10-25 DIAGNOSIS — Z720 Tobacco use: Secondary | ICD-10-CM | POA: Diagnosis not present

## 2019-10-25 DIAGNOSIS — E782 Mixed hyperlipidemia: Secondary | ICD-10-CM | POA: Diagnosis not present

## 2019-10-29 DIAGNOSIS — R69 Illness, unspecified: Secondary | ICD-10-CM | POA: Diagnosis not present

## 2019-11-16 ENCOUNTER — Encounter: Payer: Self-pay | Admitting: Nutrition

## 2019-11-16 NOTE — Patient Instructions (Signed)
Goals  Follow My Plate  Eat egg sandwich for breakfast Dont skip meals. Eat three meals a day at times discussed. Do not take meal time insulin if you skip a meal. Drink only water and cut out Diet Pepsi's Get A1C down to 7% or less. Due to possible side effects, recommend to take 2 Metformin( 500 mg each) after breakfast and 2 Metformin (500 mg each) after supper.

## 2019-11-29 ENCOUNTER — Encounter: Payer: Medicare HMO | Attending: Nurse Practitioner | Admitting: Nutrition

## 2019-11-29 ENCOUNTER — Encounter: Payer: Self-pay | Admitting: Nutrition

## 2019-11-29 ENCOUNTER — Other Ambulatory Visit: Payer: Self-pay

## 2019-11-29 DIAGNOSIS — E118 Type 2 diabetes mellitus with unspecified complications: Secondary | ICD-10-CM | POA: Diagnosis not present

## 2019-11-29 DIAGNOSIS — E782 Mixed hyperlipidemia: Secondary | ICD-10-CM | POA: Diagnosis not present

## 2019-11-29 DIAGNOSIS — E669 Obesity, unspecified: Secondary | ICD-10-CM | POA: Diagnosis not present

## 2019-11-29 DIAGNOSIS — IMO0002 Reserved for concepts with insufficient information to code with codable children: Secondary | ICD-10-CM

## 2019-11-29 DIAGNOSIS — E1165 Type 2 diabetes mellitus with hyperglycemia: Secondary | ICD-10-CM | POA: Insufficient documentation

## 2019-11-29 DIAGNOSIS — I10 Essential (primary) hypertension: Secondary | ICD-10-CM | POA: Insufficient documentation

## 2019-11-29 NOTE — Progress Notes (Signed)
Medical Nutrition Therapy:  Appt start time: 1130   end time:  1200   Assessment:  Primary concerns today: Diabetes Type 2, Obesity, Hyperlipidemia, HTN, Depression. GOIng to [employer].  Walking on treadmill. Lost 1 lb since last viist. Didn't bring his meter or his logs. FBS: 150-180's Night 120-150's.  Testing before meals-unsure of readings.  Takes 70 units of Levemeir at night,  35 units Novolog with meals and has been taking 35 units of Humalog at bedtime with the Levemir also. Advised him to only take Humalog with meals and not before bed.  Cut out diet sodas and only drinks water now. Feels better. Blood sugars are improved according to what he has said without proper documentation. Scheduled to see PCP in the new few weeks to get blood work done.  PCP Janora Norlander, NP Previous  A1C 10%. 2/21. Oct 10, 2019 9.1 %   CMP Latest Ref Rng & Units 08/09/2019 02/08/2019 11/29/2018  Glucose 70 - 99 mg/dL 215(H) 100(H) 57(L)  BUN 8 - 23 mg/dL 17 23 27(H)  Creatinine 0.61 - 1.24 mg/dL 0.80 1.02 0.91  Sodium 135 - 145 mmol/L 135 139 140  Potassium 3.5 - 5.1 mmol/L 4.4 3.6 3.8  Chloride 98 - 111 mmol/L 103 106 106  CO2 22 - 32 mmol/L 24 24 26   Calcium 8.9 - 10.3 mg/dL 9.8 9.7 9.8  Total Protein 6.5 - 8.1 g/dL 7.3 7.7 7.2  Total Bilirubin 0.3 - 1.2 mg/dL 0.5 0.6 0.2(L)  Alkaline Phos 38 - 126 U/L 63 76 63  AST 15 - 41 U/L 20 20 20   ALT 0 - 44 U/L 26 23 30      Preferred Learning Style:   No preference indicated   Learning Readiness:  Ready  Change in progress   MEDICATIONS:    DIETARY INTAKE:    24-hr recall:  B ( AM):Egg sandwich with lettuce and tomato, Milk, Snk ( AM): water L ( PM): Cheeseburger , Unsweet tea Snk ( PM  D ( PM): Fish sandwich, water, unsweet tea  Snk ( PM): water Beverages: Diet Pepsi, water-20 oz per day.  Usual physical activity:   Estimated energy needs: 1800-2000 calories 225 g carbohydrates 150 g protein 56 g fat  Progress  Towards Goal(s):  In progress.   Nutritional Diagnosis:  NB-1.1 Food and nutrition-related knowledge deficit As related to Diabetes Type 2.  As evidenced by A1C 10%..    Intervention:  Nutrition and Diabetes education provided on My Plate, CHO counting, meal planning, portion sizes, timing of meals, avoiding snacks between meals unless having a low blood sugar, target ranges for A1C and blood sugars, signs/symptoms and treatment of hyper/hypoglycemia, monitoring blood sugars, taking medications as prescribed, benefits of exercising 30 minutes per day and prevention of complications of DM.  Goals Goals  Increase lower carb vegetables with lunch and dinner Eat a piece of fruit with meals. Do not take Novolog at bedtme, only with breakfast, lunch and supper. Take only the 70 units of Levemeir  at night. Keep working out at [employer].   Teaching Method Utilized:  Visual Auditory Hands on  Handouts given during visit include:  The Plate Method  Meal Planning card  Diabetes Instructions.     Barriers to learning/adherence to lifestyle change: None  Demonstrated degree of understanding via:  Teach Back   Monitoring/Evaluation:  Dietary intake, exercise, and body weight  In 3 months. >> Recommend referral to Endocrinology due to his poorly controlled DM.  May  need to increase Levermir at night til FBS is less than 130 mg/dl.

## 2019-11-29 NOTE — Patient Instructions (Addendum)
Goals  Increase lower carb vegetables with lunch and dinner Eat a piece of fruit with meals. Do not take Novolog at bedtme, only with breakfast, lunch and supper. Take only the 70 units of Levemeir  at night. Keep working out at [employer]. Get A1C down to 7%.

## 2019-12-01 ENCOUNTER — Other Ambulatory Visit (HOSPITAL_COMMUNITY): Payer: Medicare Other

## 2019-12-08 ENCOUNTER — Ambulatory Visit (HOSPITAL_COMMUNITY): Payer: Medicare Other | Admitting: Nurse Practitioner

## 2020-01-05 DIAGNOSIS — M25519 Pain in unspecified shoulder: Secondary | ICD-10-CM | POA: Diagnosis not present

## 2020-01-05 DIAGNOSIS — R69 Illness, unspecified: Secondary | ICD-10-CM | POA: Diagnosis not present

## 2020-01-05 DIAGNOSIS — M79605 Pain in left leg: Secondary | ICD-10-CM | POA: Diagnosis not present

## 2020-01-05 DIAGNOSIS — Z79891 Long term (current) use of opiate analgesic: Secondary | ICD-10-CM | POA: Diagnosis not present

## 2020-01-05 DIAGNOSIS — M545 Low back pain: Secondary | ICD-10-CM | POA: Diagnosis not present

## 2020-01-05 DIAGNOSIS — G4459 Other complicated headache syndrome: Secondary | ICD-10-CM | POA: Diagnosis not present

## 2020-01-05 DIAGNOSIS — M25552 Pain in left hip: Secondary | ICD-10-CM | POA: Diagnosis not present

## 2020-01-05 DIAGNOSIS — M542 Cervicalgia: Secondary | ICD-10-CM | POA: Diagnosis not present

## 2020-02-09 ENCOUNTER — Other Ambulatory Visit: Payer: Self-pay

## 2020-02-09 ENCOUNTER — Inpatient Hospital Stay (HOSPITAL_COMMUNITY): Payer: Medicare HMO | Attending: Hematology

## 2020-02-09 DIAGNOSIS — F17210 Nicotine dependence, cigarettes, uncomplicated: Secondary | ICD-10-CM | POA: Diagnosis not present

## 2020-02-09 DIAGNOSIS — Z823 Family history of stroke: Secondary | ICD-10-CM | POA: Diagnosis not present

## 2020-02-09 DIAGNOSIS — R7989 Other specified abnormal findings of blood chemistry: Secondary | ICD-10-CM | POA: Insufficient documentation

## 2020-02-09 DIAGNOSIS — D72820 Lymphocytosis (symptomatic): Secondary | ICD-10-CM

## 2020-02-09 DIAGNOSIS — D72829 Elevated white blood cell count, unspecified: Secondary | ICD-10-CM | POA: Insufficient documentation

## 2020-02-09 DIAGNOSIS — Z801 Family history of malignant neoplasm of trachea, bronchus and lung: Secondary | ICD-10-CM | POA: Insufficient documentation

## 2020-02-09 DIAGNOSIS — R519 Headache, unspecified: Secondary | ICD-10-CM | POA: Insufficient documentation

## 2020-02-09 DIAGNOSIS — R69 Illness, unspecified: Secondary | ICD-10-CM | POA: Diagnosis not present

## 2020-02-09 LAB — CBC WITH DIFFERENTIAL/PLATELET
Abs Immature Granulocytes: 0.04 10*3/uL (ref 0.00–0.07)
Basophils Absolute: 0.1 10*3/uL (ref 0.0–0.1)
Basophils Relative: 1 %
Eosinophils Absolute: 0.6 10*3/uL — ABNORMAL HIGH (ref 0.0–0.5)
Eosinophils Relative: 5 %
HCT: 48 % (ref 39.0–52.0)
Hemoglobin: 16 g/dL (ref 13.0–17.0)
Immature Granulocytes: 0 %
Lymphocytes Relative: 36 %
Lymphs Abs: 4.3 10*3/uL — ABNORMAL HIGH (ref 0.7–4.0)
MCH: 30.7 pg (ref 26.0–34.0)
MCHC: 33.3 g/dL (ref 30.0–36.0)
MCV: 92.1 fL (ref 80.0–100.0)
Monocytes Absolute: 1.1 10*3/uL — ABNORMAL HIGH (ref 0.1–1.0)
Monocytes Relative: 9 %
Neutro Abs: 5.7 10*3/uL (ref 1.7–7.7)
Neutrophils Relative %: 49 %
Platelets: 386 10*3/uL (ref 150–400)
RBC: 5.21 MIL/uL (ref 4.22–5.81)
RDW: 13.1 % (ref 11.5–15.5)
WBC: 11.9 10*3/uL — ABNORMAL HIGH (ref 4.0–10.5)
nRBC: 0 % (ref 0.0–0.2)

## 2020-02-09 LAB — VITAMIN D 25 HYDROXY (VIT D DEFICIENCY, FRACTURES): Vit D, 25-Hydroxy: 16.95 ng/mL — ABNORMAL LOW (ref 30–100)

## 2020-02-09 LAB — COMPREHENSIVE METABOLIC PANEL
ALT: 22 U/L (ref 0–44)
AST: 16 U/L (ref 15–41)
Albumin: 3.8 g/dL (ref 3.5–5.0)
Alkaline Phosphatase: 62 U/L (ref 38–126)
Anion gap: 10 (ref 5–15)
BUN: 17 mg/dL (ref 8–23)
CO2: 26 mmol/L (ref 22–32)
Calcium: 9.7 mg/dL (ref 8.9–10.3)
Chloride: 100 mmol/L (ref 98–111)
Creatinine, Ser: 0.91 mg/dL (ref 0.61–1.24)
GFR calc Af Amer: >60 mL/min (ref >60)
GFR calc non Af Amer: >60 mL/min (ref >60)
Glucose, Bld: 295 mg/dL — ABNORMAL HIGH (ref 70–99)
Potassium: 4.2 mmol/L (ref 3.5–5.1)
Sodium: 136 mmol/L (ref 135–145)
Total Bilirubin: 0.4 mg/dL (ref 0.3–1.2)
Total Protein: 7.2 g/dL (ref 6.5–8.1)

## 2020-02-09 LAB — LACTATE DEHYDROGENASE: LDH: 126 U/L (ref 98–192)

## 2020-02-09 LAB — FOLATE: Folate: 14.1 ng/mL (ref >5.9)

## 2020-02-09 LAB — VITAMIN B12: Vitamin B-12: 385 pg/mL (ref 180–914)

## 2020-02-13 DIAGNOSIS — E782 Mixed hyperlipidemia: Secondary | ICD-10-CM | POA: Diagnosis not present

## 2020-02-13 DIAGNOSIS — E119 Type 2 diabetes mellitus without complications: Secondary | ICD-10-CM | POA: Diagnosis not present

## 2020-02-13 DIAGNOSIS — Z79899 Other long term (current) drug therapy: Secondary | ICD-10-CM | POA: Diagnosis not present

## 2020-02-16 ENCOUNTER — Ambulatory Visit (HOSPITAL_COMMUNITY): Payer: Medicare HMO | Admitting: Nurse Practitioner

## 2020-02-27 DIAGNOSIS — Z79899 Other long term (current) drug therapy: Secondary | ICD-10-CM | POA: Diagnosis not present

## 2020-02-27 DIAGNOSIS — R9389 Abnormal findings on diagnostic imaging of other specified body structures: Secondary | ICD-10-CM | POA: Diagnosis not present

## 2020-02-27 DIAGNOSIS — I10 Essential (primary) hypertension: Secondary | ICD-10-CM | POA: Diagnosis not present

## 2020-02-27 DIAGNOSIS — E119 Type 2 diabetes mellitus without complications: Secondary | ICD-10-CM | POA: Diagnosis not present

## 2020-02-27 DIAGNOSIS — Z794 Long term (current) use of insulin: Secondary | ICD-10-CM | POA: Diagnosis not present

## 2020-02-27 DIAGNOSIS — E782 Mixed hyperlipidemia: Secondary | ICD-10-CM | POA: Diagnosis not present

## 2020-02-27 DIAGNOSIS — Z720 Tobacco use: Secondary | ICD-10-CM | POA: Diagnosis not present

## 2020-02-27 DIAGNOSIS — D72829 Elevated white blood cell count, unspecified: Secondary | ICD-10-CM | POA: Diagnosis not present

## 2020-02-27 DIAGNOSIS — Z713 Dietary counseling and surveillance: Secondary | ICD-10-CM | POA: Diagnosis not present

## 2020-02-29 ENCOUNTER — Ambulatory Visit: Payer: Medicare HMO | Admitting: Nutrition

## 2020-04-05 DIAGNOSIS — G4459 Other complicated headache syndrome: Secondary | ICD-10-CM | POA: Diagnosis not present

## 2020-04-05 DIAGNOSIS — M542 Cervicalgia: Secondary | ICD-10-CM | POA: Diagnosis not present

## 2020-04-05 DIAGNOSIS — M25552 Pain in left hip: Secondary | ICD-10-CM | POA: Diagnosis not present

## 2020-04-05 DIAGNOSIS — Z79891 Long term (current) use of opiate analgesic: Secondary | ICD-10-CM | POA: Diagnosis not present

## 2020-04-05 DIAGNOSIS — M25519 Pain in unspecified shoulder: Secondary | ICD-10-CM | POA: Diagnosis not present

## 2020-04-05 DIAGNOSIS — M545 Low back pain, unspecified: Secondary | ICD-10-CM | POA: Diagnosis not present

## 2020-04-05 DIAGNOSIS — R69 Illness, unspecified: Secondary | ICD-10-CM | POA: Diagnosis not present

## 2020-04-05 DIAGNOSIS — M79605 Pain in left leg: Secondary | ICD-10-CM | POA: Diagnosis not present

## 2021-05-21 ENCOUNTER — Encounter (HOSPITAL_COMMUNITY): Payer: Self-pay

## 2021-05-21 ENCOUNTER — Ambulatory Visit: Payer: Medicare HMO | Admitting: Orthopaedic Surgery

## 2021-05-21 ENCOUNTER — Emergency Department (HOSPITAL_COMMUNITY): Payer: Medicare Other

## 2021-05-21 ENCOUNTER — Emergency Department (HOSPITAL_COMMUNITY)
Admission: EM | Admit: 2021-05-21 | Discharge: 2021-05-21 | Disposition: A | Discharge status: Home / Self Care | Payer: Medicare Other | Attending: Emergency Medicine | Admitting: Emergency Medicine

## 2021-05-21 DIAGNOSIS — Y92002 Bathroom of unspecified non-institutional (private) residence single-family (private) house as the place of occurrence of the external cause: Secondary | ICD-10-CM | POA: Insufficient documentation

## 2021-05-21 DIAGNOSIS — J189 Pneumonia, unspecified organism: Secondary | ICD-10-CM | POA: Insufficient documentation

## 2021-05-21 DIAGNOSIS — R000 Tachycardia, unspecified: Secondary | ICD-10-CM | POA: Diagnosis not present

## 2021-05-21 DIAGNOSIS — S0990XA Unspecified injury of head, initial encounter: Secondary | ICD-10-CM | POA: Diagnosis present

## 2021-05-21 DIAGNOSIS — Z20822 Contact with and (suspected) exposure to covid-19: Secondary | ICD-10-CM | POA: Insufficient documentation

## 2021-05-21 DIAGNOSIS — W19XXXA Unspecified fall, initial encounter: Secondary | ICD-10-CM | POA: Diagnosis not present

## 2021-05-21 DIAGNOSIS — Z7982 Long term (current) use of aspirin: Secondary | ICD-10-CM | POA: Insufficient documentation

## 2021-05-21 DIAGNOSIS — R0602 Shortness of breath: Secondary | ICD-10-CM | POA: Diagnosis not present

## 2021-05-21 DIAGNOSIS — F0390 Unspecified dementia without behavioral disturbance: Secondary | ICD-10-CM | POA: Insufficient documentation

## 2021-05-21 LAB — CBC WITH DIFFERENTIAL/PLATELET
Abs Immature Granulocytes: 0.14 10*3/uL — ABNORMAL HIGH (ref 0.00–0.07)
Basophils Absolute: 0.1 10*3/uL (ref 0.0–0.1)
Basophils Relative: 1 %
Eosinophils Absolute: 0.1 10*3/uL (ref 0.0–0.5)
Eosinophils Relative: 1 %
HCT: 44 % (ref 39.0–52.0)
Hemoglobin: 15 g/dL (ref 13.0–17.0)
Immature Granulocytes: 1 %
Lymphocytes Relative: 10 %
Lymphs Abs: 2.1 10*3/uL (ref 0.7–4.0)
MCH: 32.2 pg (ref 26.0–34.0)
MCHC: 34.1 g/dL (ref 30.0–36.0)
MCV: 94.4 fL (ref 80.0–100.0)
Monocytes Absolute: 1.7 10*3/uL — ABNORMAL HIGH (ref 0.1–1.0)
Monocytes Relative: 8 %
Neutro Abs: 18.1 10*3/uL — ABNORMAL HIGH (ref 1.7–7.7)
Neutrophils Relative %: 79 %
Platelets: 365 10*3/uL (ref 150–400)
RBC: 4.66 MIL/uL (ref 4.22–5.81)
RDW: 13.3 % (ref 11.5–15.5)
WBC: 22.4 10*3/uL — ABNORMAL HIGH (ref 4.0–10.5)
nRBC: 0 % (ref 0.0–0.2)

## 2021-05-21 LAB — BASIC METABOLIC PANEL
Anion gap: 10 (ref 5–15)
Anion gap: 13 (ref 5–15)
BUN: 35 mg/dL — ABNORMAL HIGH (ref 8–23)
BUN: 32 mg/dL — ABNORMAL HIGH (ref 8–23)
CO2: 23 mmol/L (ref 22–32)
CO2: 24 mmol/L (ref 22–32)
Calcium: 9.7 mg/dL (ref 8.9–10.3)
Calcium: 9.8 mg/dL (ref 8.9–10.3)
Chloride: 100 mmol/L (ref 98–111)
Chloride: 101 mmol/L (ref 98–111)
Creatinine, Ser: 1.16 mg/dL (ref 0.61–1.24)
Creatinine, Ser: 1.17 mg/dL (ref 0.61–1.24)
GFR, Estimated: >60 mL/min (ref >60)
GFR, Estimated: >60 mL/min (ref >60)
Glucose, Bld: 287 mg/dL — ABNORMAL HIGH (ref 70–99)
Glucose, Bld: 259 mg/dL — ABNORMAL HIGH (ref 70–99)
Potassium: 5.6 mmol/L — ABNORMAL HIGH (ref 3.5–5.1)
Potassium: 4.4 mmol/L (ref 3.5–5.1)
Sodium: 133 mmol/L — ABNORMAL LOW (ref 135–145)
Sodium: 138 mmol/L (ref 135–145)

## 2021-05-21 LAB — RESP PANEL BY RT-PCR (FLU A&B, COVID) ARPGX2
Influenza A by PCR: NEGATIVE
Influenza B by PCR: NEGATIVE
SARS Coronavirus 2 by RT PCR: NEGATIVE

## 2021-05-21 LAB — PROTIME-INR
INR: 1 (ref 0.8–1.2)
Prothrombin Time: 12.7 seconds (ref 11.4–15.2)

## 2021-05-21 MED ORDER — DOXYCYCLINE HYCLATE 100 MG PO CAPS: 100.0000 mg | ORAL_CAPSULE | Freq: Two times a day (BID) | ORAL | 0 refills | Status: AC

## 2021-05-21 MED ORDER — DOXYCYCLINE HYCLATE 100 MG PO TABS
100.0000 mg | ORAL_TABLET | Freq: Once | ORAL | Status: AC
  Administered 2021-05-21: 100 mg via ORAL
  Filled 2021-05-21: qty 1

## 2021-05-21 MED ORDER — SODIUM CHLORIDE 0.9 % IV BOLUS
500.0000 mL | Freq: Once | INTRAVENOUS | Status: AC
  Administered 2021-05-21: 500 mL via INTRAVENOUS

## 2021-05-21 NOTE — ED Provider Notes (Signed)
[facility] EMERGENCY DEPARTMENT Provider Note   CSN: [id number] Arrival date & time: 05/21/21  0935     History  Chief Complaint  Patient presents with   Logan Carey is a 71 y.o. male.  He is brought in by EMS from home after a fall.  Patient does not recall the fall.  Level 5 caveat secondary to dementia.  No other history available at this time.  Patient denies fall.  No complaints.  No headache neck pain chest pain shortness of breath nausea vomiting diarrhea.  The history is provided by the patient and the EMS personnel.  Fall This is a new problem. The problem has been resolved. Pertinent negatives include no chest pain, no abdominal pain, no headaches and no shortness of breath. Nothing aggravates the symptoms. Nothing relieves the symptoms. He has tried nothing for the symptoms. The treatment provided no relief.      Home Medications Prior to Admission medications   Medication Sig Start Date End Date Taking? Authorizing Provider  aspirin 81 MG tablet Take 81 mg by mouth daily.    [provider]  atorvastatin (LIPITOR) 40 MG tablet Take 40 mg by mouth daily.    [provider]  benazepril (LOTENSIN) 20 MG tablet Take 20 mg by mouth 2 (two) times daily. 08/28/15   [provider]  buPROPion (WELLBUTRIN XL) 300 MG 24 hr tablet  11/17/16   [provider]  diazepam (VALIUM) 5 MG tablet Take 5 mg by mouth 3 (three) times daily. 09/21/15   [provider]  fenofibrate 160 MG tablet Take 160 mg by mouth daily. 09/21/15   [provider]  gabapentin (NEURONTIN) 400 MG capsule  11/17/16   [provider]  HUMALOG KWIKPEN 100 UNIT/ML KiwkPen Inject 35 Units into the skin 3 (three) times daily. 08/21/15   [provider]  hydrochlorothiazide (HYDRODIURIL) 25 MG tablet  11/17/16   [provider]  insulin detemir (LEVEMIR) 100 UNIT/ML injection Inject 70 Units into the skin at bedtime.    [provider]  metFORMIN (GLUCOPHAGE-XR) 500 MG 24 hr tablet Take 500 mg by mouth. 4 tabs daily with supper 07/12/15   [provider]  NOVOTWIST 32G X 5 MM MISC  08/28/15   [provider]  ONE TOUCH ULTRA TEST test strip  09/11/15   [provider]  oxyCODONE-acetaminophen (PERCOCET) 10-325 MG tablet Take 10-325 tablets by mouth every 8 (eight) hours as needed. 09/21/15   [provider]  pravastatin (PRAVACHOL) 20 MG tablet Take 20 mg by mouth at bedtime. 09/21/15   [provider]  tiZANidine (ZANAFLEX) 4 MG tablet  08/27/17   [provider]  topiramate (TOPAMAX) 50 MG tablet Take 60 mg by mouth 2 (two) times daily. Patient reports topamax has recently changed to 60 mg twice daily    [provider]      Allergies    Patient has no known allergies.    Review of Systems   Review of Systems  Constitutional:  Negative for fever.  HENT:  Negative for sore throat.   Eyes:  Negative for visual disturbance.  Respiratory:  Negative for shortness of breath.   Cardiovascular:  Negative for chest pain.  Gastrointestinal:  Negative for abdominal pain.  Genitourinary:  Negative for dysuria.  Musculoskeletal:  Negative for neck pain.  Skin:  Negative for rash.  Neurological:  Negative for headaches.   Physical Exam Updated Vital Signs BP 140/69  Pulse (!) 109    Temp 98.4 F (36.9 C) (Oral)    Resp 12    Ht 5\' 10"  (1.778 m)    Wt 86.2 kg    BMI 27.26 kg/m  Physical Exam Vitals and nursing note reviewed.  Constitutional:      General: He is not in acute distress.    Appearance: Normal appearance. He is well-developed.  HENT:     Head: Normocephalic and atraumatic.  Eyes:     Conjunctiva/sclera: Conjunctivae normal.  Cardiovascular:     Rate and Rhythm: Regular rhythm. Tachycardia present.     Heart sounds: No murmur heard. Pulmonary:     Effort: Pulmonary effort is normal. No respiratory distress.     Breath sounds: Normal  breath sounds.  Abdominal:     Palpations: Abdomen is soft.     Tenderness: There is no abdominal tenderness.  Musculoskeletal:        General: No swelling, tenderness, deformity or signs of injury.     Cervical back: Neck supple.  Skin:    General: Skin is warm and dry.     Capillary Refill: Capillary refill takes less than 2 seconds.  Neurological:     General: No focal deficit present.     Mental Status: He is alert. He is disoriented.     Sensory: No sensory deficit.     Motor: No weakness.  Psychiatric:        Mood and Affect: Mood normal.    ED Results / Procedures / Treatments   Labs (all labs ordered are listed, but only abnormal results are displayed) Labs Reviewed  BASIC METABOLIC PANEL - Abnormal; Notable for the following components:      Result Value   Sodium 133 (*)    Potassium 5.6 (*)    Glucose, Bld 287 (*)    BUN 35 (*)    All other components within normal limits  CBC WITH DIFFERENTIAL/PLATELET - Abnormal; Notable for the following components:   WBC 22.4 (*)    Neutro Abs 18.1 (*)    Monocytes Absolute 1.7 (*)    Abs Immature Granulocytes 0.14 (*)    All other components within normal limits  BASIC METABOLIC PANEL - Abnormal; Notable for the following components:   Glucose, Bld 259 (*)    BUN 32 (*)    All other components within normal limits  RESP PANEL BY RT-PCR (FLU A&B, COVID) ARPGX2  PROTIME-INR  URINALYSIS, ROUTINE W REFLEX MICROSCOPIC    EKG None  Radiology CT Head Wo Contrast  Result Date: 05/21/2021 CLINICAL DATA:  Head trauma, minor (Age >= 65y) EXAM: CT HEAD WITHOUT CONTRAST TECHNIQUE: Contiguous axial images were obtained from the base of the skull through the vertex without intravenous contrast. COMPARISON:  None. FINDINGS: Brain: There is no acute intracranial hemorrhage, mass effect, or edema. Gray-white differentiation is preserved. There is no extra-axial fluid collection. Prominence of the ventricles and sulci reflects mild  parenchymal volume loss. Patchy hypoattenuation in the supratentorial white matter is nonspecific but probably reflects chronic microvascular ischemic changes. There is a chronic small vessel infarct of the left lentiform nucleus. Vascular: No hyperdense vessel or unexpected calcification. Skull: Calvarium is unremarkable. Sinuses/Orbits: No acute finding. Other: None. IMPRESSION: No evidence of acute intracranial injury. Chronic microvascular ischemic changes. Electronically Signed   By: Macy Mis M.D.   On: 05/21/2021 10:42   DG Chest Port 1 View  Result Date: 05/21/2021 CLINICAL DATA:  [place] at home today.  Cough.  EXAM: PORTABLE CHEST 1 VIEW COMPARISON:  03/12/2007 FINDINGS: Artifact overlies the chest. Heart size is normal. Mediastinal shadows are normal except for mild atherosclerosis of the aorta. The right lung is clear. Question minimal patchy infiltrate at the left lung base. No dense consolidation, collapse or effusion. No evidence of heart failure. IMPRESSION: No active disease versus minimal patchy infiltrate at the left lung base. Electronically Signed   By: Nelson Chimes M.D.   On: 05/21/2021 11:29    Procedures Procedures    Medications Ordered in ED Medications  sodium chloride 0.9 % bolus 500 mL (0 mLs Intravenous Stopped 05/21/21 1200)  doxycycline (VIBRA-TABS) tablet 100 mg (100 mg Oral Given 05/21/21 1341)    ED Course/ Medical Decision Making/ A&P Clinical Course as of 05/21/21 1919  Tue May 21, 2021  1336 Wife here now states the patient has been coughing for a few days.  His chest x-ray ordered and interpreted by me as possible infiltrate right base.  Will cover with antibiotics. [MB]    Clinical Course User Index [MB] Hayden Rasmussen, MD                           Medical Decision Making  This patient presents to the ED for concern of possible head injury, this involves an extensive number of treatment options, and is a complaint that carries with it a high risk of  complications and morbidity.  The differential diagnosis includes skull fracture, bleed, contusion, infection, dehydration   Additional history obtained:  Additional history obtained from EMS and patient's spouse External records from outside source obtained and reviewed including in epic no recent admissions   Lab Tests:  I Ordered, reviewed, and interpreted labs.  The pertinent results include: CBC elevated white count normal hemoglobin, chemistries normal other than elevated glucose and BUN, COVID and flu negative, INR normal, urinalysis ordered not obtained   Imaging Studies ordered:  I ordered imaging studies including chest x-ray and CT head I independently visualized and interpreted imaging which showed possible opacity left base I agree with the radiologist interpretation   Cardiac Monitoring:  The patient was maintained on a cardiac monitor.  I personally viewed and interpreted the cardiac monitored which showed an underlying rhythm of: Normal sinus rhythm   Medicines ordered and prescription drug management:  I ordered medication including IV fluids and oral antibiotics for dehydration and possible pneumonia Reevaluation of the patient after these medicines showed that the patient improved I have reviewed the patients home medicines and have made adjustments as needed     Reevaluation:  After the interventions noted above, I reevaluated the patient and found that they have :improved   Dispostion:  After consideration of the diagnostic results and the patients response to treatment feel that the patent would benefit from close outpatient follow-up with PCP.  Reviewed with wife.  Will cover with antibiotics for possible infection.  Oxygenation has been adequate and no indications for admission at this time..          Final Clinical Impression(s) / ED Diagnoses Final diagnoses:  Fall, initial encounter  Injury of head, initial encounter  Community  acquired pneumonia, unspecified laterality    Rx / DC Orders ED Discharge Orders          Ordered    doxycycline (VIBRAMYCIN) 100 MG capsule  2 times daily        05/21/21 1523  Hayden Rasmussen, MD 05/21/21 [place]ly [place]

## 2021-05-21 NOTE — Discharge Instructions (Addendum)
You are seen in the emergency department for evaluation of injuries from a fall.  You had a CAT scan of your head along with a chest x-ray and blood work.  Chest x-ray showed a possible pneumonia.  We are treating you with antibiotics.  Please contact your primary care doctor for close follow-up.  Return to the emergency department if any worsening or concerning symptoms.

## 2021-05-21 NOTE — ED Triage Notes (Signed)
Pt brought in by EMS after a fall in the bathroom at home. Pt denies falling. He is on a blood thinner, but has not taken today. Denies hitting his head. Glucose 300.

## 2021-06-13 ENCOUNTER — Ambulatory Visit: Payer: Medicare Other

## 2021-06-13 ENCOUNTER — Other Ambulatory Visit: Payer: Self-pay

## 2021-06-13 ENCOUNTER — Ambulatory Visit (INDEPENDENT_AMBULATORY_CARE_PROVIDER_SITE_OTHER): Payer: Medicare Other | Admitting: Orthopaedic Surgery

## 2021-06-13 ENCOUNTER — Encounter: Payer: Self-pay | Admitting: Orthopaedic Surgery

## 2021-06-13 VITALS — BP 146/98 | HR 82 | Ht 70.0 in | Wt 180.0 lb

## 2021-06-13 DIAGNOSIS — M79675 Pain in left toe(s): Secondary | ICD-10-CM

## 2021-06-13 NOTE — Progress Notes (Signed)
Subjective:    Patient ID: Logan Carey, male    DOB: 1951-05-17, 71 y.o.   MRN: [id number]  HPI He has had problem with ingrown toenail on the left foot great toe and and off for many years.  It has recently gotten a little red on the medial side these past few weeks and it is not going away.  He has no trauma, no gout, no fever or chills.  He wears size 10 1/2 shoes and has worn the same size for years.  He does not get resized when he gets his new shoes.   Review of Systems  Musculoskeletal:  Positive for gait problem.  All other systems reviewed and are negative. For Review of Systems, all other systems reviewed and are negative.  The following is a summary of the past history medically, past history surgically, known current medicines, social history and family history.  This information is gathered electronically by the computer from prior information and documentation.  I review this each visit and have found including this information at this point in the chart is beneficial and informative.   Past Medical History:  Diagnosis Date   Diabetes mellitus without complication ([place])    High cholesterol    Hypertension    Leukocytosis 10/01/2015   Lymphocytosis 10/01/2015   Thrombocytosis 10/01/2015    Past Surgical History:  Procedure Laterality Date   CHOLECYSTECTOMY      Current Outpatient Medications on File Prior to Visit  Medication Sig Dispense Refill   aspirin 81 MG tablet Take 81 mg by mouth daily.     benazepril (LOTENSIN) 20 MG tablet Take 20 mg by mouth 2 (two) times daily.     buPROPion (WELLBUTRIN SR) 150 MG 12 hr tablet Take 150 mg by mouth 2 (two) times daily.     cholecalciferol (VITAMIN D3) 25 MCG (1000 UNIT) tablet Take 2,000 Units by mouth daily.     diazepam (VALIUM) 5 MG tablet Take 5 mg by mouth 3 (three) times daily.     doxycycline (VIBRAMYCIN) 100 MG capsule Take 1 capsule (100 mg total) by mouth 2 (two) times daily. 14 capsule 0   gabapentin  (NEURONTIN) 400 MG capsule Take 400 mg by mouth 4 (four) times daily.     HUMALOG KWIKPEN 100 UNIT/ML KiwkPen Inject 35 Units into the skin 3 (three) times daily.     insulin detemir (LEVEMIR) 100 UNIT/ML injection Inject 70 Units into the skin at bedtime.     multivitamin-lutein (OCUVITE-LUTEIN) CAPS capsule Take 1 capsule by mouth daily.     oxyCODONE-acetaminophen (PERCOCET) 10-325 MG tablet Take 10-325 tablets by mouth every 8 (eight) hours as needed.     tiZANidine (ZANAFLEX) 4 MG tablet Take 4 mg by mouth at bedtime.     topiramate (TOPAMAX) 100 MG tablet Take 100 mg by mouth daily.     No current facility-administered medications on file prior to visit.    Social History   Socioeconomic History   Marital status: Married    Spouse name: Not on file   Number of children: Not on file   Years of education: Not on file   Highest education level: Not on file  Occupational History   Not on file  Tobacco Use   Smoking status: Former    Packs/day: 0.25    Years: 56.00    Pack years: 14.00    Types: Cigarettes   Smokeless tobacco: Never  Substance and Sexual Activity   Alcohol use: No  Comment: Use to drink 1/2 of a 5th of [employer] every weekend quitting in ~ 1977   Drug use: No   Sexual activity: Not on file  Other Topics Concern   Not on file  Social History Narrative   Not on file   Social Determinants of Health   Financial Resource Strain: Not on file  Food Insecurity: Not on file  Transportation Needs: Not on file  Physical Activity: Not on file  Stress: Not on file  Social Connections: Not on file  Intimate Partner Violence: Not on file    Family History  Problem Relation Age of Onset   Stroke Father    Cancer Father 47       Lung cancer-smoker   Hypertension Sister    Hypertension Brother    Hyperlipidemia Son     BP (!) 146/98    Pulse 82    Ht 5\' 10"  (1.778 m)    Wt 180 lb (81.6 kg)    BMI 25.83 kg/m   Body mass index is 25.83 kg/m.      Objective:   Physical Exam Vitals and nursing note reviewed. Exam conducted with a chaperone present.  Constitutional:      Appearance: He is well-developed.  HENT:     Head: Normocephalic and atraumatic.  Eyes:     Conjunctiva/sclera: Conjunctivae normal.     Pupils: Pupils are equal, round, and reactive to light.  Cardiovascular:     Rate and Rhythm: Normal rate and regular rhythm.  Pulmonary:     Effort: Pulmonary effort is normal.  Abdominal:     Palpations: Abdomen is soft.  Musculoskeletal:     Cervical back: Normal range of motion and neck supple.       Feet:  Skin:    General: Skin is warm and dry.  Neurological:     Mental Status: He is alert and oriented to person, place, and time.     Cranial Nerves: No cranial nerve deficit.     Motor: No abnormal muscle tone.     Coordination: Coordination normal.     Deep Tendon Reflexes: Reflexes are normal and symmetric. Reflexes normal.  Psychiatric:        Behavior: Behavior normal.        Thought Content: Thought content normal.        Judgment: Judgment normal.  X-rays were done of the great toe left, reported separately.        Assessment & Plan:   Encounter Diagnosis  Name Primary?   Pain of left great toe Yes   I have asked him to go to shoe store and re-measure his feet.  Often as one gets older, the foot may "stretch" out and actually get a little longer.  I will gave sample of doxycycline 100 take one a day for ten days.  Return in two weeks.  Call if any problem.  Precautions discussed.  Electronically Signed Sanjuana Kava, MD 1/26/202311:41 AM

## 2021-06-27 ENCOUNTER — Ambulatory Visit: Payer: Medicare Other | Admitting: Orthopaedic Surgery

## 2021-10-22 ENCOUNTER — Other Ambulatory Visit (HOSPITAL_COMMUNITY): Payer: Self-pay | Admitting: Nurse Practitioner

## 2021-10-22 ENCOUNTER — Other Ambulatory Visit: Payer: Self-pay | Admitting: Nurse Practitioner

## 2021-10-22 DIAGNOSIS — Z720 Tobacco use: Secondary | ICD-10-CM

## 2022-05-27 ENCOUNTER — Other Ambulatory Visit: Payer: Self-pay

## 2022-05-27 DIAGNOSIS — F17210 Nicotine dependence, cigarettes, uncomplicated: Secondary | ICD-10-CM

## 2022-05-27 DIAGNOSIS — Z122 Encounter for screening for malignant neoplasm of respiratory organs: Secondary | ICD-10-CM

## 2022-05-27 DIAGNOSIS — Z87891 Personal history of nicotine dependence: Secondary | ICD-10-CM

## 2022-07-11 ENCOUNTER — Ambulatory Visit (HOSPITAL_COMMUNITY)
Admission: RE | Admit: 2022-07-11 | Discharge: 2022-07-11 | Disposition: A | Discharge status: Home / Self Care | Payer: Medicare PPO | Source: Ambulatory Visit | Attending: *Deleted | Admitting: *Deleted

## 2022-07-11 DIAGNOSIS — F17210 Nicotine dependence, cigarettes, uncomplicated: Secondary | ICD-10-CM | POA: Diagnosis present

## 2022-07-11 DIAGNOSIS — Z122 Encounter for screening for malignant neoplasm of respiratory organs: Secondary | ICD-10-CM | POA: Insufficient documentation

## 2022-07-11 DIAGNOSIS — J439 Emphysema, unspecified: Secondary | ICD-10-CM | POA: Diagnosis not present

## 2022-07-11 DIAGNOSIS — I700 Atherosclerosis of aorta: Secondary | ICD-10-CM | POA: Diagnosis not present

## 2022-07-11 DIAGNOSIS — R918 Other nonspecific abnormal finding of lung field: Secondary | ICD-10-CM | POA: Diagnosis not present

## 2022-07-11 DIAGNOSIS — Z87891 Personal history of nicotine dependence: Secondary | ICD-10-CM

## 2022-07-11 DIAGNOSIS — I2510 Atherosclerotic heart disease of native coronary artery without angina pectoris: Secondary | ICD-10-CM | POA: Diagnosis not present

## 2022-07-14 ENCOUNTER — Other Ambulatory Visit: Payer: Self-pay | Admitting: Acute Care

## 2022-07-14 DIAGNOSIS — Z122 Encounter for screening for malignant neoplasm of respiratory organs: Secondary | ICD-10-CM

## 2022-07-14 DIAGNOSIS — Z87891 Personal history of nicotine dependence: Secondary | ICD-10-CM

## 2022-07-14 DIAGNOSIS — F17210 Nicotine dependence, cigarettes, uncomplicated: Secondary | ICD-10-CM

## 2022-07-17 ENCOUNTER — Encounter: Payer: Self-pay | Admitting: Radiology

## 2023-07-13 ENCOUNTER — Ambulatory Visit (HOSPITAL_COMMUNITY)
Admission: RE | Admit: 2023-07-13 | Discharge: 2023-07-13 | Disposition: A | Discharge status: Home / Self Care | Payer: Medicare PPO | Source: Ambulatory Visit | Attending: Nurse Practitioner | Admitting: Nurse Practitioner

## 2023-07-13 DIAGNOSIS — Z87891 Personal history of nicotine dependence: Secondary | ICD-10-CM

## 2023-07-13 DIAGNOSIS — F17210 Nicotine dependence, cigarettes, uncomplicated: Secondary | ICD-10-CM

## 2023-07-13 DIAGNOSIS — Z122 Encounter for screening for malignant neoplasm of respiratory organs: Secondary | ICD-10-CM

## 2023-08-04 ENCOUNTER — Other Ambulatory Visit: Payer: Self-pay

## 2023-08-04 DIAGNOSIS — F17210 Nicotine dependence, cigarettes, uncomplicated: Secondary | ICD-10-CM

## 2023-08-04 DIAGNOSIS — Z87891 Personal history of nicotine dependence: Secondary | ICD-10-CM

## 2023-08-04 DIAGNOSIS — Z122 Encounter for screening for malignant neoplasm of respiratory organs: Secondary | ICD-10-CM

## 2024-01-08 ENCOUNTER — Encounter: Payer: Self-pay | Admitting: Radiology

## 2024-03-21 ENCOUNTER — Encounter: Payer: Self-pay | Admitting: Radiology
